# Patient Record
Sex: Female | Born: 1970 | Race: Black or African American | Hispanic: No | Marital: Married | Smoking: Former smoker
Health system: Southern US, Community
[De-identification: ages and names within clinical notes are randomized; demographics above are authoritative.]

## PROBLEM LIST (undated history)

## (undated) DIAGNOSIS — D252 Subserosal leiomyoma of uterus: Secondary | ICD-10-CM

## (undated) DIAGNOSIS — F419 Anxiety disorder, unspecified: Secondary | ICD-10-CM

## (undated) DIAGNOSIS — N760 Acute vaginitis: Secondary | ICD-10-CM

## (undated) DIAGNOSIS — N939 Abnormal uterine and vaginal bleeding, unspecified: Secondary | ICD-10-CM

## (undated) DIAGNOSIS — B373 Candidiasis of vulva and vagina: Secondary | ICD-10-CM

## (undated) DIAGNOSIS — I10 Essential (primary) hypertension: Secondary | ICD-10-CM

## (undated) DIAGNOSIS — B3731 Acute candidiasis of vulva and vagina: Secondary | ICD-10-CM

## (undated) HISTORY — PX: DILATION AND CURETTAGE OF UTERUS: SHX78

## (undated) HISTORY — DX: Acute vaginitis: N76.0

## (undated) HISTORY — DX: Abnormal uterine and vaginal bleeding, unspecified: N93.9

## (undated) HISTORY — DX: Subserosal leiomyoma of uterus: D25.2

## (undated) HISTORY — DX: Acute candidiasis of vulva and vagina: B37.31

## (undated) HISTORY — DX: Essential (primary) hypertension: I10

## (undated) HISTORY — DX: Candidiasis of vulva and vagina: B37.3

## (undated) HISTORY — DX: Anxiety disorder, unspecified: F41.9

---

## 2004-07-06 ENCOUNTER — Emergency Department: Payer: Self-pay | Admitting: Emergency Medicine

## 2005-03-29 ENCOUNTER — Emergency Department: Payer: Self-pay | Admitting: Emergency Medicine

## 2005-12-05 ENCOUNTER — Emergency Department: Payer: Self-pay | Admitting: Emergency Medicine

## 2007-06-15 ENCOUNTER — Ambulatory Visit: Payer: Self-pay | Admitting: Internal Medicine

## 2010-08-08 ENCOUNTER — Ambulatory Visit: Payer: Self-pay | Admitting: Family Medicine

## 2011-04-18 ENCOUNTER — Ambulatory Visit: Payer: Self-pay | Admitting: Internal Medicine

## 2011-04-19 ENCOUNTER — Ambulatory Visit: Payer: Self-pay | Admitting: Internal Medicine

## 2011-04-19 LAB — CBC WITH DIFFERENTIAL/PLATELET
Basophil #: 0 10*3/uL (ref 0.0–0.1)
Basophil %: 0.4 %
Eosinophil %: 3.2 %
HCT: 43.5 % (ref 35.0–47.0)
Lymphocyte #: 1.5 10*3/uL (ref 1.0–3.6)
MCH: 29.8 pg (ref 26.0–34.0)
MCV: 92 fL (ref 80–100)
Monocyte #: 0.4 10*3/uL (ref 0.0–0.7)
Monocyte %: 7.7 %
Neutrophil #: 2.6 10*3/uL (ref 1.4–6.5)
RDW: 12.7 % (ref 11.5–14.5)
WBC: 4.7 10*3/uL (ref 3.6–11.0)

## 2012-10-29 LAB — HM PAP SMEAR: HM PAP: NEGATIVE

## 2012-11-17 LAB — HM MAMMOGRAPHY

## 2014-10-23 ENCOUNTER — Telehealth: Payer: Self-pay | Admitting: *Deleted

## 2014-10-23 NOTE — Telephone Encounter (Addendum)
Pt states returned from vacation and left her night splint in the hotel, would like to pick one up in the morning, and what cost.  I informed pt the night splint would be $225.00. Pt states she will call to see if the hotel would mail to her.

## 2015-03-28 ENCOUNTER — Other Ambulatory Visit: Payer: Self-pay | Admitting: Obstetrics and Gynecology

## 2015-03-28 DIAGNOSIS — R920 Mammographic microcalcification found on diagnostic imaging of breast: Secondary | ICD-10-CM

## 2015-04-05 ENCOUNTER — Ambulatory Visit (INDEPENDENT_AMBULATORY_CARE_PROVIDER_SITE_OTHER): Payer: Commercial Managed Care - PPO | Admitting: Family Medicine

## 2015-04-05 ENCOUNTER — Encounter: Payer: Self-pay | Admitting: Family Medicine

## 2015-04-05 VITALS — BP 170/95 | HR 94 | Resp 16 | Ht 67.0 in | Wt 172.0 lb

## 2015-04-05 DIAGNOSIS — IMO0001 Reserved for inherently not codable concepts without codable children: Secondary | ICD-10-CM

## 2015-04-05 DIAGNOSIS — R030 Elevated blood-pressure reading, without diagnosis of hypertension: Secondary | ICD-10-CM | POA: Diagnosis not present

## 2015-04-05 DIAGNOSIS — Z3041 Encounter for surveillance of contraceptive pills: Secondary | ICD-10-CM

## 2015-04-05 DIAGNOSIS — S8391XA Sprain of unspecified site of right knee, initial encounter: Secondary | ICD-10-CM

## 2015-04-05 MED ORDER — NAPROXEN 500 MG PO TABS: 500.0000 mg | ORAL_TABLET | Freq: Two times a day (BID) | ORAL | Status: DC

## 2015-04-05 NOTE — Progress Notes (Signed)
Date:  04/05/2015   Name:  Katherine Fuentes   DOB:  [birth year]   MRN:  [id number]  PCP:  No primary care provider on file.    Chief Complaint: Establish Care and Knee Pain   History of Present Illness:  This is a 45 y.o. female who slipped on the ice one week ago, c/o R knee swelling and pain with flexion since then, taking ibuprofen 400 mg tid with some relief, able to bear weight and walk well. BP has been elevated since starting OCP, followed by Dr. Glennon Mac who plans to stop next month if BP not improved, just had blood work done with him last week.  Review of Systems:  Review of Systems  Constitutional: Negative for fever.  Respiratory: Negative for shortness of breath.   Cardiovascular: Negative for leg swelling.    There are no active problems to display for this patient.   Prior to Admission medications   Medication Sig Start Date End Date Taking? Authorizing Provider  norgestimate-ethinyl estradiol (ORTHO-CYCLEN,SPRINTEC,PREVIFEM) 0.25-35 MG-MCG tablet Take 1 tablet by mouth daily.   Yes Historical Provider, MD  naproxen (NAPROSYN) 500 MG tablet Take 1 tablet (500 mg total) by mouth 2 (two) times daily with a meal. 04/05/15   Adline Potter, MD    No Known Allergies  History reviewed. No pertinent past surgical history.  Social History  Substance Use Topics  . Smoking status: Former Smoker    Quit date: 04/04/2004  . Smokeless tobacco: Never Used  . Alcohol Use: No    Family History  Problem Relation Age of Onset  . Family history unknown: Yes    Medication list has been reviewed and updated.  Physical Examination: BP 170/95 mmHg  Pulse 94  Resp 16  Ht 5\' 7"  (1.702 m)  Wt 172 lb (78.019 kg)  BMI 26.93 kg/m2  LMP 02/11/2015 (Exact Date)  Physical Exam  Constitutional: She appears well-developed and well-nourished.  Musculoskeletal:  R knee stable with mod edema but no erythema or warmth, no sign distal edema  Neurological: She is alert.  Skin: Skin  is warm and dry.  Psychiatric: She has a normal mood and affect. Her behavior is normal.  Nursing note and vitals reviewed.   Assessment and Plan:  1. Right knee sprain, initial encounter Naprosyn x 2 wks, call if no better in 1 week, refer to PT then  2. Elevated BP Likely due to OCP and ibuprofen, GYN following  3. Oral contraceptive use Per GYN, may need alternative contraception  No Follow-up on file.  [name]. [facility]  04/05/2015

## 2015-04-10 ENCOUNTER — Ambulatory Visit: Payer: Self-pay

## 2015-04-10 ENCOUNTER — Other Ambulatory Visit: Payer: Self-pay

## 2015-04-12 ENCOUNTER — Ambulatory Visit (INDEPENDENT_AMBULATORY_CARE_PROVIDER_SITE_OTHER): Payer: Commercial Managed Care - PPO | Admitting: Family Medicine

## 2015-04-12 ENCOUNTER — Encounter: Payer: Self-pay | Admitting: Family Medicine

## 2015-04-12 ENCOUNTER — Other Ambulatory Visit: Payer: Self-pay | Admitting: Family Medicine

## 2015-04-12 ENCOUNTER — Ambulatory Visit
Admission: RE | Admit: 2015-04-12 | Discharge: 2015-04-12 | Disposition: A | Discharge status: Home / Self Care | Payer: Commercial Managed Care - PPO | Source: Ambulatory Visit | Attending: Family Medicine | Admitting: Family Medicine

## 2015-04-12 VITALS — BP 122/80

## 2015-04-12 DIAGNOSIS — M25561 Pain in right knee: Secondary | ICD-10-CM

## 2015-04-12 DIAGNOSIS — Z3041 Encounter for surveillance of contraceptive pills: Secondary | ICD-10-CM | POA: Diagnosis not present

## 2015-04-12 MED ORDER — TRAMADOL HCL 50 MG PO TABS: 50.0000 mg | ORAL_TABLET | Freq: Three times a day (TID) | ORAL | Status: DC | PRN

## 2015-04-12 NOTE — Progress Notes (Signed)
Date:  04/12/2015   Name:  Katherine Fuentes   DOB:  1970-05-27   MRN:  [id number]  PCP:  Adline Potter, MD    Chief Complaint: Knee Pain   History of Present Illness:  This is a 45 y.o. female seen last week for R knee sprain when slipped on ice, placed on Naprosyn, pain unimproved and knee now swollen.  Review of Systems:  Review of Systems  Neurological:       No distal numbness/tingling/weakness    Patient Active Problem List   Diagnosis Date Noted  . Oral contraceptive use 04/12/2015    Prior to Admission medications   Medication Sig Start Date End Date Taking? Authorizing Provider  naproxen (NAPROSYN) 500 MG tablet Take 1 tablet (500 mg total) by mouth 2 (two) times daily with a meal. 04/05/15  Yes Adline Potter, MD  norgestimate-ethinyl estradiol (ORTHO-CYCLEN,SPRINTEC,PREVIFEM) 0.25-35 MG-MCG tablet Take 1 tablet by mouth daily.   Yes Historical Provider, MD  traMADol (ULTRAM) 50 MG tablet Take 1 tablet (50 mg total) by mouth every 8 (eight) hours as needed. 04/12/15   Adline Potter, MD    No Known Allergies  No past surgical history on file.  Social History  Substance Use Topics  . Smoking status: Former Smoker    Quit date: 04/04/2004  . Smokeless tobacco: Never Used  . Alcohol Use: No    Family History  Problem Relation Age of Onset  . Family history unknown: Yes    Medication list has been reviewed and updated.  Physical Examination: BP 122/80 mmHg  LMP 04/10/2015  Physical Exam  Constitutional: She appears well-developed and well-nourished.  Musculoskeletal:  Knee stable all planes, no pain with patellar movement, mild edema, no erythema or warmth  Nursing note and vitals reviewed.   Assessment and Plan:  1. Right knee pain Persistent x 2 weeks, check XR, tramadol prn pain, consider PT or ortho referral - DG Knee Complete 4 Views Right; Future  2. Oral contraceptive use  Return if symptoms worsen or fail to improve.  [name]. [facility]  04/12/2015

## 2015-04-18 ENCOUNTER — Ambulatory Visit: Payer: Commercial Managed Care - PPO | Attending: Family Medicine | Admitting: Physical Therapy

## 2015-04-18 ENCOUNTER — Encounter: Payer: Self-pay | Admitting: Physical Therapy

## 2015-04-18 DIAGNOSIS — M25661 Stiffness of right knee, not elsewhere classified: Secondary | ICD-10-CM | POA: Diagnosis present

## 2015-04-18 DIAGNOSIS — R262 Difficulty in walking, not elsewhere classified: Secondary | ICD-10-CM | POA: Diagnosis present

## 2015-04-18 DIAGNOSIS — M25561 Pain in right knee: Secondary | ICD-10-CM | POA: Insufficient documentation

## 2015-04-18 NOTE — Therapy (Signed)
[facility] [address] [place], [place], [address] Phone: [phone]   Fax:  [phone]  Physical Therapy Evaluation  Patient Details  Name: Katherine Fuentes MRN: [id number] Date of Birth: 1970-07-05 Referring Provider: Adline Potter  Encounter Date: 04/18/2015      PT End of Session - 04/18/15 1804    Visit Number 1   Number of Visits 8   PT Start Time [id number]   PT Stop Time [id number]   PT Time Calculation (min) 50 min   Activity Tolerance Patient tolerated treatment well;Patient limited by pain   Behavior During Therapy [facility] for tasks assessed/performed      History reviewed. No pertinent past medical history.  History reviewed. No pertinent past surgical history.  There were no vitals filed for this visit.  Visit Diagnosis:  Right knee pain  Knee stiffness, right  Difficulty walking down stairs      Subjective Assessment - 04/18/15 1800    Subjective Pt presents to PT with R knee pain after almost sliipping on ice. Pt reports 0/10 pain at rest but increase pain with certain activities and motions.    Limitations Standing;House hold activities   How long can you stand comfortably? >25 mins   Patient Stated Goals Stand for long periods of time with no pain/ Ascend/descend stairs without feeling of giving out   Currently in Pain? No/denies     Objective: Knee AROM L flex: 123 deg. R flex: 115 deg with pain.  R/L knee ext: WNL.  MMT L hip flex: 5/5. R hip flex: 4+/5.  L knee flex: 5/5. R knee flex: 5/5.  L knee ext: 5/5. R knee ext: 4+/5.  Circumferential Measurements L knee- joint line: 38 cm. mid calf: 35 cm. distal quad: 43 cm.  R knee- joint line: 40 cm. mid calf: 35cm. distal quad: 43 cm.   Ice: 10 mins         PT Education - 04/18/15 1803    Education provided Yes   Education Details see above handout. Pt was also instructed to use ice 2-3x/ day or biofreeze to reduce swelling.   Person(s) Educated  Patient   Methods Explanation;Demonstration;Handout   Comprehension Verbalized understanding;Returned demonstration            Plan - 04/18/15 1805    Clinical Impression Statement Pt is a 45 y/o female who presents to PT with R knee pain after almost slipping on ice. Pt states she was walking and almost slipped but was able to catch herself, does not recall hitting her knee on ground. Pt states R knee feels stiff in the morning but feels better after walking. 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 Pt reports 0/10 pain at rest but the pain increases to 3/10 with extension and 6/10 with flexion with pain occuring at superior to patella. That pain immediately subsides when out of painful position. Pt states she experiences giving way feeling when descending steps at work with an increase in pain to 3/10 easing out of position. Thessaly was negative. Pt is tender to palpation at superior and superiomedial to patella with visible swelling around joint line. Tight B hamstrings noted. B. Knee AROM: L flexion: 123 deg. R flex: 115 deg with pain. R/L knee ext: WNL. MMT: L hip flex: 5/5. R hip flex: 4+/5. L knee flex: 5/5. R knee flex: 5/5. L knee ext: 5/5. R  knee ext: 4+/5. Circumferential Measurements: L knee- joint line: 38 cm. mid calf: 35 cm. distal quad: 43 cm. R knee- joint line: 40 cm. mid calf: 35cm. distal quad: 43 cm. Patellar motion is normal bilaterally. Pt will benefit from skilled PT to improve flexibility and increase AROM in order to improve pain-free mobility.    Pt will benefit from skilled therapeutic intervention in order to improve on the following deficits Abnormal gait;Decreased balance;Decreased mobility;Decreased strength;Impaired flexibility;Pain;Difficulty walking;Decreased range of motion;Decreased endurance;Decreased activity tolerance   Rehab Potential Good   PT Frequency 2x / week   PT Duration 4  weeks   PT Treatment/Interventions ADLs/Self Care Home Management;Cryotherapy;Electrical Stimulation;Moist Heat;Balance training;Therapeutic exercise;Therapeutic activities;Functional mobility training;Stair training;Gait training;Patient/family education;Manual techniques;Passive range of motion   PT Next Visit Plan eccentric quad control/flexibility/LEFS   PT Home Exercise Plan see above handout   Consulted and Agree with Plan of Care Patient         Problem List Patient Active Problem List   Diagnosis Date Noted  . Oral contraceptive use 04/12/2015   Pura Spice, PT, DPT # [id number]   04/19/2015, 8:56 AM  [facility] [address] [place], [place], [address] Phone: [phone]   Fax:  [phone]  Name: Katherine Fuentes MRN: [id number] Date of Birth: Apr 11, 1970

## 2015-04-24 ENCOUNTER — Encounter: Payer: Commercial Managed Care - PPO | Admitting: Physical Therapy

## 2015-04-26 ENCOUNTER — Encounter: Payer: Self-pay | Admitting: Physical Therapy

## 2015-04-26 ENCOUNTER — Ambulatory Visit: Payer: Commercial Managed Care - PPO | Admitting: Physical Therapy

## 2015-04-26 DIAGNOSIS — R262 Difficulty in walking, not elsewhere classified: Secondary | ICD-10-CM

## 2015-04-26 DIAGNOSIS — M25661 Stiffness of right knee, not elsewhere classified: Secondary | ICD-10-CM

## 2015-04-26 DIAGNOSIS — M25561 Pain in right knee: Secondary | ICD-10-CM | POA: Diagnosis not present

## 2015-04-26 NOTE — Therapy (Signed)
[facility] [address] [place], [place], [address] Phone: [phone]   Fax:  [phone]  Physical Therapy Treatment  Patient Details  Name: Katherine Fuentes MRN: [id number] Date of Birth: June 07, 1970 Referring Provider: Adline Potter  Encounter Date: 04/26/2015      PT End of Session - 04/26/15 1534    Visit Number 2   Number of Visits 8   Authorization - Visit Number 2   PT Start Time [id number]   PT Stop Time [id number]   PT Time Calculation (min) 59 min   Activity Tolerance Patient tolerated treatment well;Patient limited by pain   Behavior During Therapy [facility] for tasks assessed/performed      History reviewed. No pertinent past medical history.  History reviewed. No pertinent past surgical history.  There were no vitals filed for this visit.  Visit Diagnosis:  Right knee pain  Difficulty walking down stairs  Knee stiffness, right      Subjective Assessment - 04/26/15 1531    Subjective Pt presents to therapy today reporting 0/10 pain. Pt reports pain is worst when she is navigating stairs and is performing her work duties. Pt reports her work got her a mat to work on, which is helping to decrease her knee pain. Pt reports she is using the Biofreeze and wearing ice packs to help alleviate pain.   How long can you stand comfortably? >25 mins   Patient Stated Goals Stand for long periods of time with no pain/ Ascend/descend stairs without feeling of giving out        Objective: Manual: superior/inferior patellar mobilizations x 4 for 30 seconds. Proximal hamstring stretch x 2 for 30 seconds (pt had a hard time relaxing). STM to distal quad/hamstring. Ther ex : Step ups on 6" step in //-bars 3 x 10. Forwards/lateral  step ups on BOSU ball in //-bars 30 with verbal postural cueing. Eccentric heel taps off 3" step in //-bars 2 x 10. Hamstring stretch off stairs x 3 for 20 seconds.  Pt response to treatment for medical necessity:  Pt will benefit from skilled PT to increase quad strengthening in order to safely ascend/descend stairs. Pt will benefit from manual techniques in order to improve ROM and decrease pain.          PT Long Term Goals - 04/18/15 1828    PT LONG TERM GOAL #1   Title Pt will increase R knee AROM to >120 deg. in order to complete ADLs with ease.   Baseline 115 deg on 2/1   Time 4   Period Weeks   Status New   PT LONG TERM GOAL #2   Title Pt will be able to stand for >20 mins without pain in order to complete work duties.   Baseline <20 mins 2/1   Time 4   Period Weeks   Status New   PT LONG TERM GOAL #3   Title Pt will increase R knee ext MMT to 5/5 in order to improve functional mobility.   Baseline 4+/5 on 2/1   Time 4   Period Weeks   Status New   PT LONG TERM GOAL #4   Title Pt will be able to descend >5 steps pain free in order to safely navigate steps at home/work.   Time 4   Period Weeks   Status New           Plan - 04/26/15 1615    Clinical Impression Statement Pt demonstrates  poor eccentric control with step downs on 6" step and requires verbal cueing to step down with a slower pace. Pt also required postural cueing throughout session with ther ex. Pt demonstrated slight hypomobility with inferior patellar mobilizations. Tightness noted at distal quad and distal hamstring, with pt describing hamstring as a burning pain. Pt demonstrated decreased medial arch and was instructed on the importance of proper footwear, especially since her job entails being on her feet all day.    Pt will benefit from skilled therapeutic intervention in order to improve on the following deficits Abnormal gait;Decreased balance;Decreased mobility;Decreased strength;Impaired flexibility;Pain;Difficulty walking;Decreased range of motion;Decreased endurance;Decreased activity tolerance   Rehab Potential Good   PT Frequency 2x / week   PT Duration 4 weeks   PT Treatment/Interventions ADLs/Self  Care Home Management;Cryotherapy;Electrical Stimulation;Moist Heat;Balance training;Therapeutic exercise;Therapeutic activities;Functional mobility training;Stair training;Gait training;Patient/family education;Manual techniques;Passive range of motion   PT Next Visit Plan eccentric quad control/flexibility/LEFS   PT Home Exercise Plan see above handout   Consulted and Agree with Plan of Care Patient        Problem List Patient Active Problem List   Diagnosis Date Noted  . Oral contraceptive use 04/12/2015    Georgina Pillion, SPT and Dorice Lamas, SPT 04/26/2015, 4:22 PM  [facility] [address] [place], [place], [address] Phone: [phone]   Fax:  [phone]  Name: Katherine Fuentes MRN: [id number] Date of Birth: 1970-07-22

## 2015-05-01 ENCOUNTER — Encounter: Payer: Commercial Managed Care - PPO | Admitting: Physical Therapy

## 2015-05-03 ENCOUNTER — Other Ambulatory Visit: Payer: Self-pay

## 2015-05-03 ENCOUNTER — Ambulatory Visit: Admission: RE | Admit: 2015-05-03 | Payer: Self-pay | Source: Ambulatory Visit

## 2015-05-03 ENCOUNTER — Encounter: Payer: Commercial Managed Care - PPO | Admitting: Physical Therapy

## 2015-05-08 ENCOUNTER — Encounter: Payer: Commercial Managed Care - PPO | Admitting: Physical Therapy

## 2015-05-10 ENCOUNTER — Encounter: Payer: Commercial Managed Care - PPO | Admitting: Physical Therapy

## 2015-05-15 ENCOUNTER — Encounter: Payer: Commercial Managed Care - PPO | Admitting: Physical Therapy

## 2015-05-17 ENCOUNTER — Encounter: Payer: Commercial Managed Care - PPO | Admitting: Physical Therapy

## 2016-05-13 IMAGING — CR DG KNEE COMPLETE 4+V*R*
4 series · 4 of 4 positions shown · non-contrast
Comparison: No recent.

CLINICAL DATA: Fall.

EXAM:
RIGHT KNEE - COMPLETE 4+ VIEW

[knee ap]
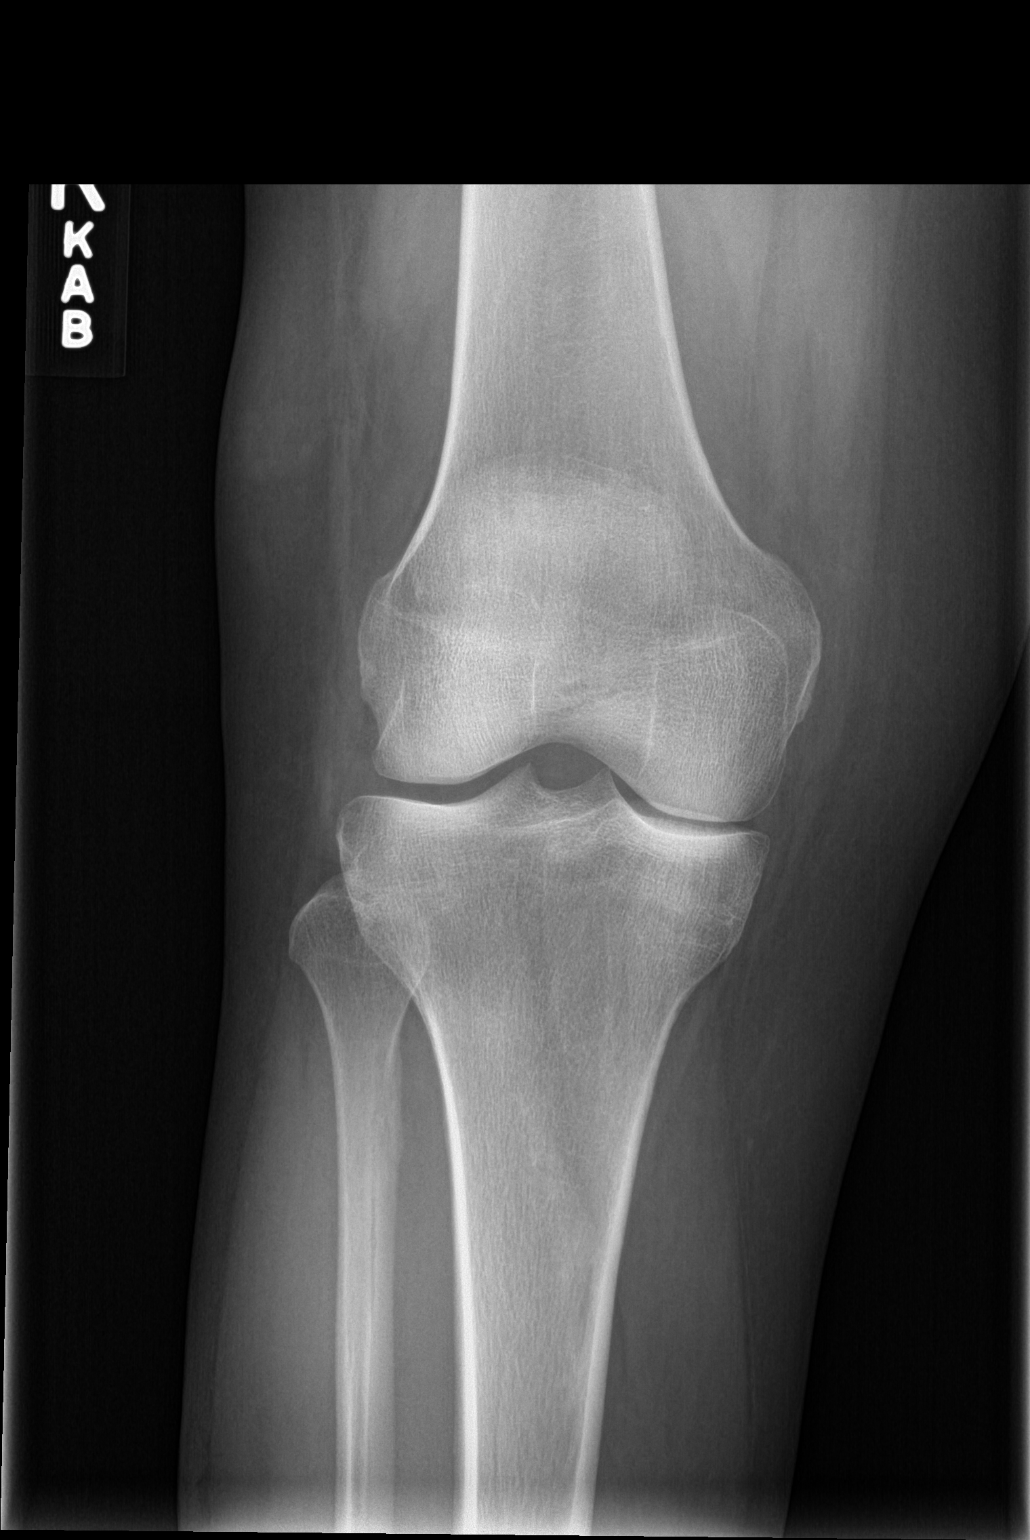

[tunnel]
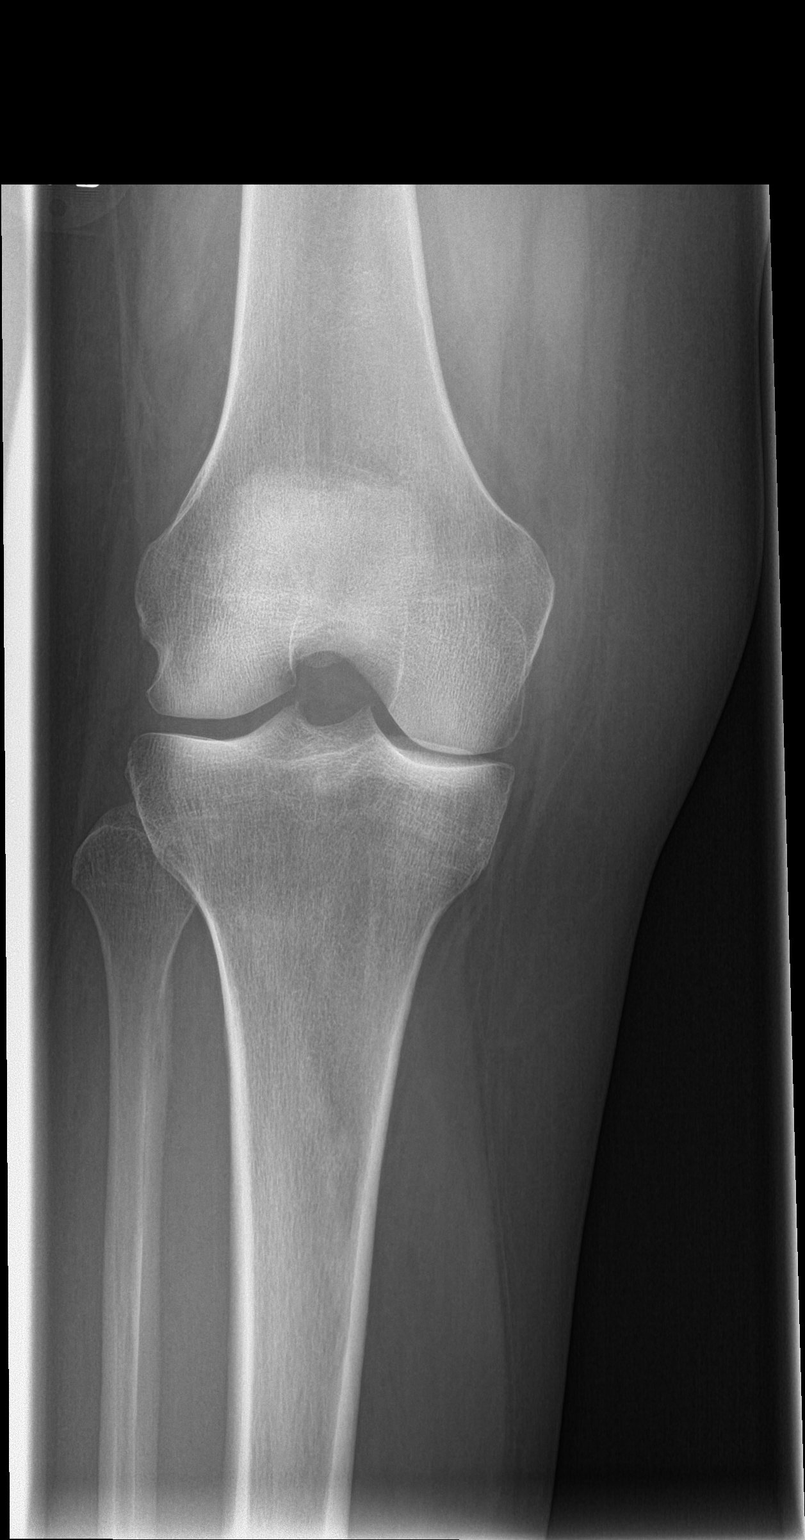

[knee lat]
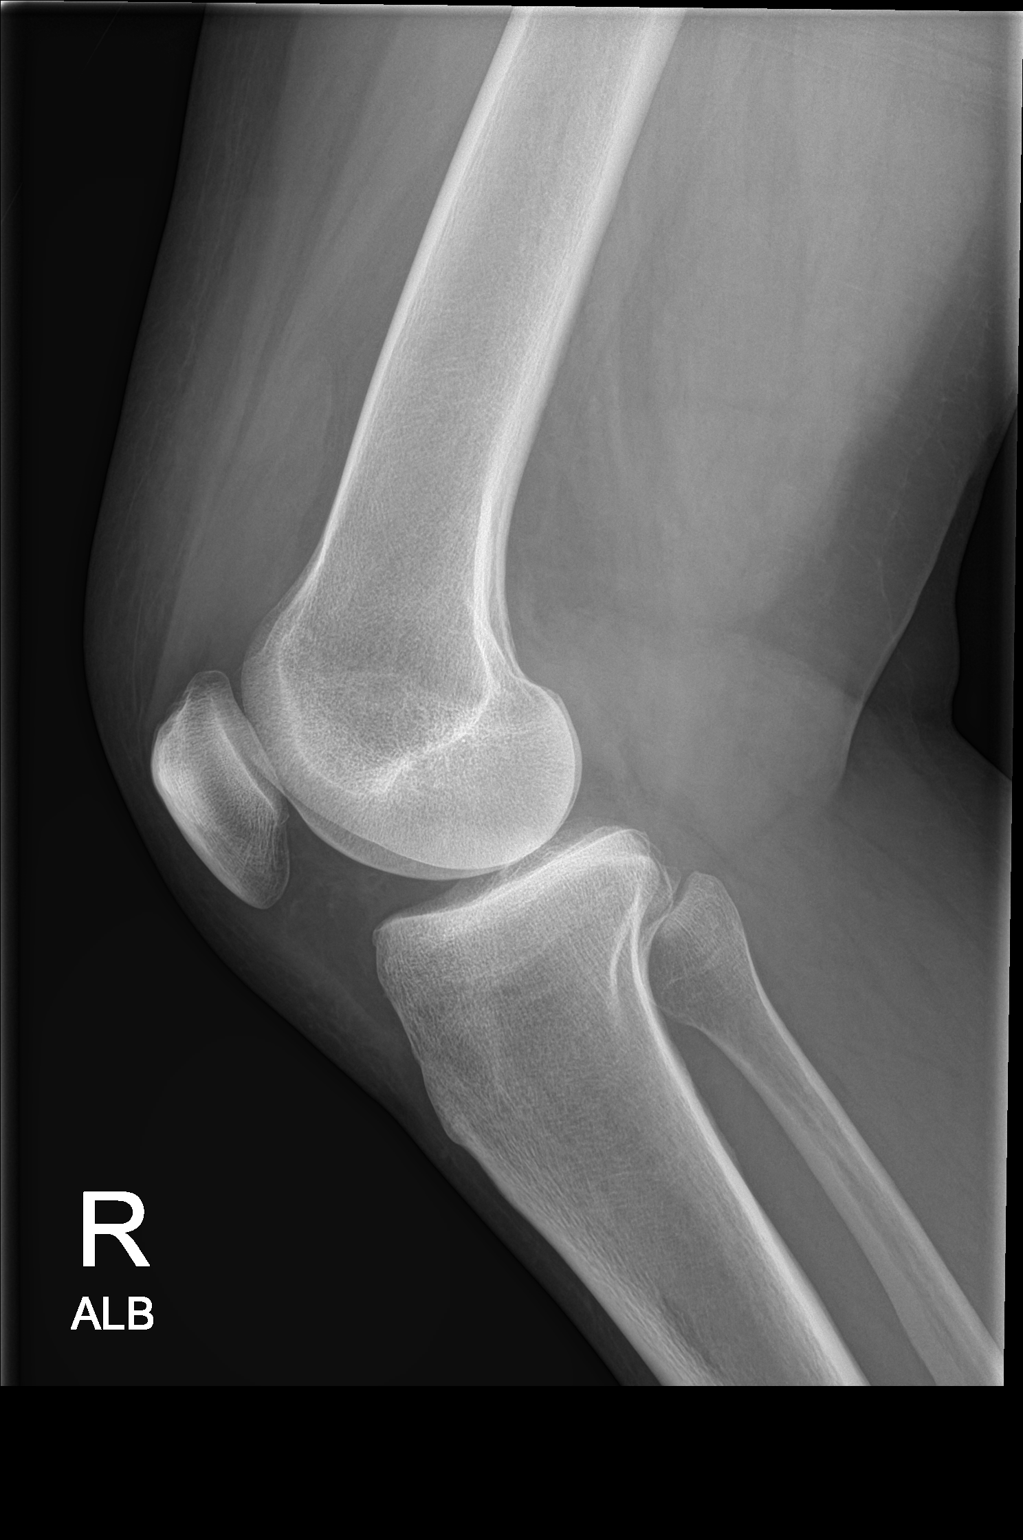

[patella skyline]
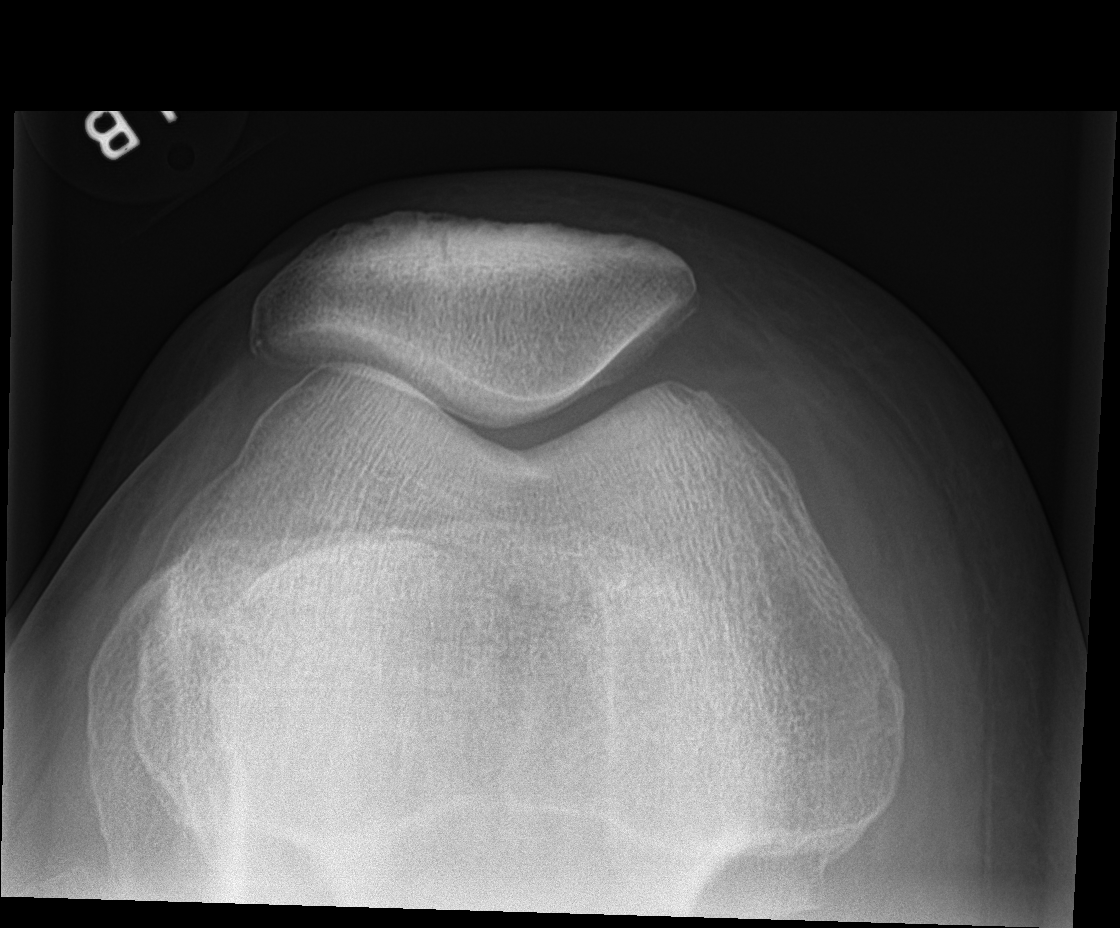

[4 of 4 positions shown; findings below may reference images not displayed]

FINDINGS: No acute bony abnormality identified. Mild degenerative changes
noted about the medial compartment small knee joint effusion noted.
IMPRESSION: Negative.

## 2016-05-14 ENCOUNTER — Telehealth: Payer: Self-pay | Admitting: Obstetrics and Gynecology

## 2016-05-14 MED ORDER — NORGESTIMATE-ETH ESTRADIOL 0.25-35 MG-MCG PO TABS: 1.0000 | ORAL_TABLET | Freq: Every day | ORAL | 0 refills | Status: DC

## 2016-05-14 NOTE — Telephone Encounter (Signed)
Patient was called to r/s annual that was scheduled for 3/1 due to Epic overbook and will need her bp med and bc refilled one time before her appt on 03/23. She has enough of bp meds for today, but needs by tomorrow.  Patient Pharm.  [facility] [place], [place] Please advise.

## 2016-05-14 NOTE — Telephone Encounter (Signed)
1 refill sent to pharm to last pt to annual exam

## 2016-05-15 ENCOUNTER — Ambulatory Visit: Payer: Self-pay | Admitting: Obstetrics and Gynecology

## 2016-05-19 ENCOUNTER — Telehealth: Payer: Self-pay

## 2016-05-19 DIAGNOSIS — I10 Essential (primary) hypertension: Secondary | ICD-10-CM

## 2016-05-19 MED ORDER — HYDROCHLOROTHIAZIDE 25 MG PO TABS: 25.0000 mg | ORAL_TABLET | Freq: Every day | ORAL | 3 refills | Status: DC

## 2016-05-19 NOTE — Telephone Encounter (Signed)
Pt aware RX is ready and will be here for f/u appt

## 2016-05-19 NOTE — Telephone Encounter (Signed)
Please let pt know I called her in addition refills. Thank you.

## 2016-05-19 NOTE — Telephone Encounter (Signed)
Dr Glennon Mac, according to Kerby Nora looks like the medication is hydrochlorothiazide. Can you RX this medication for pt please?

## 2016-05-19 NOTE — Telephone Encounter (Signed)
Pt called states pharm doesn't have rx refill for blood pressure med.  Pt is out.  Her appt was rescheduled to 3/23rd.

## 2016-06-06 ENCOUNTER — Ambulatory Visit: Payer: Self-pay | Admitting: Obstetrics and Gynecology

## 2016-06-30 ENCOUNTER — Telehealth: Payer: Self-pay

## 2016-06-30 NOTE — Telephone Encounter (Signed)
Pt needs appt scheduled before refills will be sent in. Please schedule for pt

## 2016-06-30 NOTE — Telephone Encounter (Signed)
Pt calling for refill on xanax.  Pharmacy send request.  She is out of med. [phone]

## 2016-07-01 NOTE — Telephone Encounter (Signed)
Pt is schedule for 07/04/16 with Dr. Glennon Mac

## 2016-07-04 ENCOUNTER — Ambulatory Visit (INDEPENDENT_AMBULATORY_CARE_PROVIDER_SITE_OTHER): Payer: Commercial Managed Care - PPO | Admitting: Obstetrics and Gynecology

## 2016-07-04 ENCOUNTER — Inpatient Hospital Stay
Admission: RE | Admit: 2016-07-04 | Discharge: 2016-07-04 | Disposition: A | Discharge status: Home / Self Care | Payer: Self-pay | Source: Ambulatory Visit | Attending: *Deleted | Admitting: *Deleted

## 2016-07-04 ENCOUNTER — Encounter: Payer: Self-pay | Admitting: Obstetrics and Gynecology

## 2016-07-04 ENCOUNTER — Other Ambulatory Visit: Payer: Self-pay | Admitting: *Deleted

## 2016-07-04 VITALS — BP 116/74 | Ht 67.0 in | Wt 156.0 lb

## 2016-07-04 DIAGNOSIS — F419 Anxiety disorder, unspecified: Secondary | ICD-10-CM

## 2016-07-04 DIAGNOSIS — Z1231 Encounter for screening mammogram for malignant neoplasm of breast: Secondary | ICD-10-CM | POA: Diagnosis not present

## 2016-07-04 DIAGNOSIS — Z9289 Personal history of other medical treatment: Secondary | ICD-10-CM

## 2016-07-04 DIAGNOSIS — Z0000 Encounter for general adult medical examination without abnormal findings: Secondary | ICD-10-CM

## 2016-07-04 DIAGNOSIS — Z1389 Encounter for screening for other disorder: Secondary | ICD-10-CM | POA: Diagnosis not present

## 2016-07-04 DIAGNOSIS — Z113 Encounter for screening for infections with a predominantly sexual mode of transmission: Secondary | ICD-10-CM

## 2016-07-04 DIAGNOSIS — Z13220 Encounter for screening for lipoid disorders: Secondary | ICD-10-CM | POA: Diagnosis not present

## 2016-07-04 DIAGNOSIS — Z1329 Encounter for screening for other suspected endocrine disorder: Secondary | ICD-10-CM | POA: Diagnosis not present

## 2016-07-04 DIAGNOSIS — Z3041 Encounter for surveillance of contraceptive pills: Secondary | ICD-10-CM | POA: Diagnosis not present

## 2016-07-04 DIAGNOSIS — N6322 Unspecified lump in the left breast, upper inner quadrant: Secondary | ICD-10-CM

## 2016-07-04 DIAGNOSIS — Z124 Encounter for screening for malignant neoplasm of cervix: Secondary | ICD-10-CM

## 2016-07-04 DIAGNOSIS — Z01419 Encounter for gynecological examination (general) (routine) without abnormal findings: Secondary | ICD-10-CM | POA: Diagnosis not present

## 2016-07-04 DIAGNOSIS — I10 Essential (primary) hypertension: Secondary | ICD-10-CM

## 2016-07-04 DIAGNOSIS — Z131 Encounter for screening for diabetes mellitus: Secondary | ICD-10-CM | POA: Diagnosis not present

## 2016-07-04 DIAGNOSIS — Z1339 Encounter for screening examination for other mental health and behavioral disorders: Secondary | ICD-10-CM

## 2016-07-04 DIAGNOSIS — Z1331 Encounter for screening for depression: Secondary | ICD-10-CM

## 2016-07-04 DIAGNOSIS — Z1239 Encounter for other screening for malignant neoplasm of breast: Secondary | ICD-10-CM

## 2016-07-04 MED ORDER — HYDROCHLOROTHIAZIDE 25 MG PO TABS: 25.0000 mg | ORAL_TABLET | Freq: Every day | ORAL | 3 refills | Status: DC

## 2016-07-04 MED ORDER — ALPRAZOLAM 0.25 MG PO TABS: 0.2500 mg | ORAL_TABLET | Freq: Three times a day (TID) | ORAL | 0 refills | Status: DC | PRN

## 2016-07-04 NOTE — Progress Notes (Signed)
PCP: Katherine Potter, MD  Chief Complaint  Patient presents with  . Annual Exam    History of Present Illness:  [name] is a 46 y.o. G1P0010 who LMP was Patient's last menstrual period was 05/06/2016., presents today for her annual examination.  Her menses are regular every 28-30 days, lasting 6 day(s).  Dysmenorrhea none. She does not have intermenstrual bleeding.  She does not have vasomotor sx.   She is sexually issues. No pain or other issues with intercourse. She does not have vaginal dryness.  Last Pap: October 28, 2012  Results were: no abnormalities /neg HPV DNA.  Hx of STDs: none  Last mammogram: 2014, BiRads 0. HAS NOT HAD FOLLOW UP. No breast complaints today. There is no FH of breast cancer. There is no FH of ovarian cancer. The patient does not do self-breast exams.  Colonoscopy: never DEXA: has not been screened for osteoporosis  Tobacco use: The patient quit smoking at age 65. Alcohol use: moderate to heavy Exercise: not active  The patient wears seatbelts: yes.     Review of Systems  Constitutional: Negative.   HENT: Negative.   Eyes: Negative.   Respiratory: Negative.   Cardiovascular: Negative.   Gastrointestinal: Negative.   Genitourinary: Negative.   Musculoskeletal: Negative.   Skin: Negative.   Neurological: Negative.   Psychiatric/Behavioral: Negative.     Past Medical History:  Diagnosis Date  . Abnormal uterine bleeding   . Acute vaginitis   . Anxiety   . Hypertension   . Monilial vulvovaginitis   . Subserous leiomyoma of uterus     Past Surgical History: No surgeries  Medications:   Medication Sig Start Date End Date Taking? Authorizing Provider  ALPRAZolam Duanne Moron) 0.25 MG tablet Take 0.25 mg by mouth at bedtime as needed for anxiety.   Yes Historical Provider, MD  hydrochlorothiazide (HYDRODIURIL) 25 MG tablet Take 1 tablet (25 mg total) by mouth daily. 05/19/16  Yes Will Bonnet, MD  norgestimate-ethinyl  estradiol (ORTHO-CYCLEN,SPRINTEC,PREVIFEM) 0.25-35 MG-MCG tablet Take 1 tablet by mouth daily. 05/14/16  Yes Rod Can, CNM    Allergies: No Known Allergies  Gynecologic History:  Patient's last menstrual period was 05/06/2016. Contraception: OCP (estrogen/progesterone)  Obstetric History: G1P0010  Family History  Problem Relation Age of Onset  . Colon cancer Maternal Grandfather     Social History   Social History  . Marital status: Married    Spouse name: N/A  . Number of children: N/A  . Years of education: N/A   Occupational History  . Not on file.   Social History Main Topics  . Smoking status: Former Smoker    Quit date: 04/04/2004  . Smokeless tobacco: Never Used  . Alcohol use No  . Drug use: No  . Sexual activity: Yes    Birth control/ protection: Pill   Other Topics Concern  . Not on file   Social History Narrative  . No narrative on file    Physical Exam BP 116/74   Ht 5\' 7"  (1.702 m)   Wt 156 lb (70.8 kg)   LMP 05/06/2016   BMI 24.43 kg/m   Physical Exam  Constitutional: She is oriented to person, place, and time. She appears well-developed and well-nourished. No distress.  Genitourinary: Uterus normal. Pelvic exam was performed with patient supine. There is no rash, tenderness, lesion or injury on the right labia. There is no rash, tenderness, lesion or injury on the left labia. No erythema, tenderness or  bleeding in the vagina. No signs of injury around the vagina. No vaginal discharge found. Right adnexum does not display mass, does not display tenderness and does not display fullness. Left adnexum does not display mass, does not display tenderness and does not display fullness. Cervix does not exhibit motion tenderness or discharge.   Uterus is mobile and anteverted. Uterus is not enlarged, tender or exhibiting a mass.  HENT:  Head: Normocephalic and atraumatic.  Eyes: EOM are normal. No scleral icterus.  Neck: Normal range of motion. Neck  supple. No thyromegaly present.  Cardiovascular: Normal rate and regular rhythm.  Exam reveals no gallop and no friction rub.   No murmur heard. Pulmonary/Chest: Effort normal and breath sounds normal. No respiratory distress. She has no wheezes. She has no rales. Right breast exhibits no inverted nipple, no mass, no nipple discharge, no skin change and no tenderness. Left breast exhibits mass (See image). Left breast exhibits no inverted nipple, no nipple discharge, no skin change and no tenderness.    Abdominal: Soft. Bowel sounds are normal. She exhibits no distension and no mass. There is no tenderness. There is no rebound and no guarding.  Musculoskeletal: Normal range of motion. She exhibits no edema or tenderness.  Lymphadenopathy:    She has no cervical adenopathy.       Right: No inguinal adenopathy present.       Left: No inguinal adenopathy present.  Neurological: She is alert and oriented to person, place, and time. No cranial nerve deficit.  Skin: Skin is warm and dry. No rash noted. No erythema.  Psychiatric: She has a normal mood and affect. Her behavior is normal. Judgment normal.   Female chaperone present for pelvic and breast  portions of the physical exam  Results: AUDIT Questionnaire (screen for alcoholism): 11 PHQ-9: 4   Assessment: 46 y.o. G1P0010 here for routine annual gynecologic examination. Poor follow up on mammography with small finding today.    Plan: 1. Women's annual routine gynecological examination - Hgb A1c w/o eAG - Comprehensive metabolic panel - CBC - Lipid Panel With LDL/HDL Ratio - TSH - IGP,CtNg,AptimaHPV,rfx16/18,45 (pap smear)  2. Pap smear for cervical cancer screening - IGP,CtNg,AptimaHPV,rfx16/18,45 (pap smear)  3. Benign essential hypertension - Comprehensive metabolic panel - CBC - hydrochlorothiazide (HYDRODIURIL) 25 MG tablet; Take 1 tablet (25 mg total) by mouth daily.  Dispense: 90 tablet; Refill: 3  6. Screening for  alcohol problem AUDIT questionnaire with score of 11 today. Discussed not using to self-medicate. Discussed warning signs of too much drinking and long-term health effects.    7. Screening for depression No evidence of depression today  8. Anxiety - continue to have significant anxiety. Has had a good response to infrequent xanax dosing in past.  Going through a divorce currently.  Follow-up prn.  - ALPRAZolam (XANAX) 0.25 MG tablet; Take 1 tablet (0.25 mg total) by mouth 3 (three) times daily as needed for anxiety.  Dispense: 30 tablet; Refill: 0  9. Screening cholesterol level - Lipid Panel With LDL/HDL Ratio  10. Screening for thyroid disorder - TSH  11. Screening for diabetes mellitus - Hgb A1c w/o eAG  12. Laboratory exam ordered as part of routine general medical examination - Comprehensive metabolic panel - CBC  13. Encounter for surveillance of contraceptive pills - continue current. Refills prn.  48. Screen for STD (sexually transmitted disease) - IGP,CtNg,AptimaHPV,rfx16/18,45 (pap smear with gonorrhea/chlamydia testing)  15. Benign hypertension - hydrochlorothiazide (HYDRODIURIL) 25 MG tablet; Take 1 tablet (25  mg total) by mouth daily.  Dispense: 90 tablet; Refill: 3  16. Breast lump on left side at 11 o'clock position - MM DIAG BREAST TOMO BILATERAL; Future - US BREAST LTD UNI RIGHT INC AXILLA; Future - US BREAST LTD UNI LEFT INC AXILLA; Future    Prentice Docker, MD 07/04/2016 8:20 AM

## 2016-07-08 ENCOUNTER — Encounter: Payer: Self-pay | Admitting: Obstetrics and Gynecology

## 2016-07-08 LAB — COMPREHENSIVE METABOLIC PANEL
ALT: 12 IU/L (ref 0–32)
AST: 29 IU/L (ref 0–40)
Albumin/Globulin Ratio: 1.2 (ref 1.2–2.2)
Albumin: 4.1 g/dL (ref 3.5–5.5)
Alkaline Phosphatase: 67 IU/L (ref 39–117)
BILIRUBIN TOTAL: 0.4 mg/dL (ref 0.0–1.2)
BUN/Creatinine Ratio: 13 (ref 9–23)
BUN: 9 mg/dL (ref 6–24)
CHLORIDE: 100 mmol/L (ref 96–106)
CO2: 20 mmol/L (ref 18–29)
Calcium: 9.5 mg/dL (ref 8.7–10.2)
Creatinine, Ser: 0.71 mg/dL (ref 0.57–1.00)
GFR calc non Af Amer: 102 mL/min/{1.73_m2} (ref >59)
GFR, EST AFRICAN AMERICAN: 118 mL/min/{1.73_m2} (ref >59)
GLOBULIN, TOTAL: 3.3 g/dL (ref 1.5–4.5)
Glucose: 87 mg/dL (ref 65–99)
POTASSIUM: 4 mmol/L (ref 3.5–5.2)
SODIUM: 141 mmol/L (ref 134–144)
Total Protein: 7.4 g/dL (ref 6.0–8.5)

## 2016-07-08 LAB — LIPID PANEL WITH LDL/HDL RATIO
CHOLESTEROL TOTAL: 215 mg/dL — AB (ref 100–199)
HDL: 105 mg/dL (ref >39)
LDL CALC: 82 mg/dL (ref 0–99)
LDl/HDL Ratio: 0.8 ratio (ref 0.0–3.2)
TRIGLYCERIDES: 140 mg/dL (ref 0–149)
VLDL Cholesterol Cal: 28 mg/dL (ref 5–40)

## 2016-07-08 LAB — CBC
HEMOGLOBIN: 13.5 g/dL (ref 11.1–15.9)
Hematocrit: 40.1 % (ref 34.0–46.6)
MCH: 30.3 pg (ref 26.6–33.0)
MCHC: 33.7 g/dL (ref 31.5–35.7)
MCV: 90 fL (ref 79–97)
PLATELETS: 269 10*3/uL (ref 150–379)
RBC: 4.46 x10E6/uL (ref 3.77–5.28)
RDW: 13.9 % (ref 12.3–15.4)
WBC: 5.3 10*3/uL (ref 3.4–10.8)

## 2016-07-08 LAB — HGB A1C W/O EAG: HEMOGLOBIN A1C: 5 % (ref 4.8–5.6)

## 2016-07-08 LAB — TSH: TSH: 0.956 u[IU]/mL (ref 0.450–4.500)

## 2016-07-09 LAB — IGP,CTNG,APTIMAHPV,RFX16/18,45
CHLAMYDIA, NUC. ACID AMP: NEGATIVE
Gonococcus by Nucleic Acid Amp: NEGATIVE
HPV Aptima: NEGATIVE
PAP Smear Comment: 0

## 2016-07-10 ENCOUNTER — Other Ambulatory Visit: Payer: Self-pay

## 2016-07-10 ENCOUNTER — Telehealth: Payer: Self-pay | Admitting: Obstetrics and Gynecology

## 2016-07-10 MED ORDER — NORGESTIMATE-ETH ESTRADIOL 0.25-35 MG-MCG PO TABS: 1.0000 | ORAL_TABLET | Freq: Every day | ORAL | 11 refills | Status: DC

## 2016-07-10 NOTE — Telephone Encounter (Signed)
Resent OCP RX. Form was faxed, was put in scan pile so will have to wait until it is scanned in so I can reprint and fax again. Please let pt know if she calls back tomorrow or Monday as I will not be in the office.

## 2016-07-10 NOTE — Telephone Encounter (Signed)
Patient l/m on my v/m that she saw Dr Glennon Mac last Friday, that her employer faxed a form for her physical and has not received the completed form back. Also, her Sprintec prescription is usually written so that she can pick up 3 mos at a time, for one year. This time, she was only given 1 mo w/ no refills. Please contact patient at [phone].

## 2016-07-15 ENCOUNTER — Ambulatory Visit: Admission: RE | Admit: 2016-07-15 | Payer: Commercial Managed Care - PPO | Source: Ambulatory Visit

## 2016-07-15 ENCOUNTER — Inpatient Hospital Stay: Admission: RE | Admit: 2016-07-15 | Payer: Commercial Managed Care - PPO | Source: Ambulatory Visit

## 2016-07-15 ENCOUNTER — Other Ambulatory Visit: Payer: Commercial Managed Care - PPO

## 2017-02-20 ENCOUNTER — Ambulatory Visit: Payer: Commercial Managed Care - PPO | Admitting: Obstetrics and Gynecology

## 2017-05-27 ENCOUNTER — Ambulatory Visit: Payer: Commercial Managed Care - PPO | Admitting: Obstetrics and Gynecology

## 2017-06-03 ENCOUNTER — Other Ambulatory Visit: Payer: Self-pay | Admitting: Obstetrics and Gynecology

## 2017-07-26 ENCOUNTER — Other Ambulatory Visit: Payer: Self-pay | Admitting: Obstetrics and Gynecology

## 2017-07-26 DIAGNOSIS — I10 Essential (primary) hypertension: Secondary | ICD-10-CM

## 2017-07-27 NOTE — Telephone Encounter (Signed)
Please advise 

## 2017-07-27 NOTE — Telephone Encounter (Signed)
Pt scheduled AE for 08/18/17 w/SDJ. She ran out of BP meds yesterday. [facility] faxed for refill. Pt requesting a refill to last til her AE. [phone]

## 2017-07-28 NOTE — Telephone Encounter (Signed)
Patient is calling to follow up on her Medication refill. Please advise.

## 2017-08-18 ENCOUNTER — Ambulatory Visit: Payer: Self-pay | Admitting: Obstetrics and Gynecology

## 2017-08-18 ENCOUNTER — Other Ambulatory Visit: Payer: Self-pay

## 2017-08-18 MED ORDER — NORGESTIMATE-ETH ESTRADIOL 0.25-35 MG-MCG PO TABS: 1.0000 | ORAL_TABLET | Freq: Every day | ORAL | 0 refills | Status: DC

## 2017-08-18 NOTE — Telephone Encounter (Signed)
Pt aware rx sent in. 

## 2017-09-02 ENCOUNTER — Ambulatory Visit: Payer: Self-pay | Admitting: Obstetrics and Gynecology

## 2017-09-23 ENCOUNTER — Ambulatory Visit: Payer: Self-pay | Admitting: Obstetrics and Gynecology

## 2017-10-09 ENCOUNTER — Other Ambulatory Visit: Payer: Self-pay | Admitting: Obstetrics and Gynecology

## 2017-10-09 ENCOUNTER — Telehealth: Payer: Self-pay

## 2017-10-09 DIAGNOSIS — Z3041 Encounter for surveillance of contraceptive pills: Secondary | ICD-10-CM

## 2017-10-09 DIAGNOSIS — F419 Anxiety disorder, unspecified: Secondary | ICD-10-CM

## 2017-10-09 MED ORDER — ALPRAZOLAM 0.25 MG PO TABS: 0.2500 mg | ORAL_TABLET | Freq: Three times a day (TID) | ORAL | 0 refills | Status: DC | PRN

## 2017-10-09 MED ORDER — NORGESTIMATE-ETH ESTRADIOL 0.25-35 MG-MCG PO TABS: 1.0000 | ORAL_TABLET | Freq: Every day | ORAL | 0 refills | Status: DC

## 2017-10-09 NOTE — Telephone Encounter (Signed)
Both rx's sent

## 2017-10-09 NOTE — Telephone Encounter (Signed)
Pt has annual scheduled for 8/14. She would like a refill on her OCP and Xanax. CB# [phone]

## 2017-10-27 ENCOUNTER — Other Ambulatory Visit: Payer: Self-pay

## 2017-10-27 ENCOUNTER — Telehealth: Payer: Self-pay

## 2017-10-27 DIAGNOSIS — Z3041 Encounter for surveillance of contraceptive pills: Secondary | ICD-10-CM

## 2017-10-27 DIAGNOSIS — I10 Essential (primary) hypertension: Secondary | ICD-10-CM

## 2017-10-27 MED ORDER — NORGESTIMATE-ETH ESTRADIOL 0.25-35 MG-MCG PO TABS: 1.0000 | ORAL_TABLET | Freq: Every day | ORAL | 0 refills | Status: DC

## 2017-10-27 MED ORDER — HYDROCHLOROTHIAZIDE 25 MG PO TABS: 25.0000 mg | ORAL_TABLET | Freq: Every day | ORAL | 0 refills | Status: DC

## 2017-10-27 NOTE — Telephone Encounter (Signed)
IT's ok to refill both to get her to her appointment for now.  I will discuss any recommended changes at that time.  Thank you!

## 2017-10-27 NOTE — Telephone Encounter (Signed)
Pt has a appointment on sep 19th she needs a refill to last until then for [facility] and BP pills I will refill the [facility] pills, please advise on BP pills

## 2017-10-27 NOTE — Telephone Encounter (Signed)
Done

## 2017-10-28 ENCOUNTER — Ambulatory Visit: Payer: Self-pay | Admitting: Obstetrics and Gynecology

## 2017-11-05 NOTE — Telephone Encounter (Signed)
This encounter was created in error - please disregard.

## 2017-12-03 ENCOUNTER — Ambulatory Visit: Payer: Self-pay | Admitting: Obstetrics and Gynecology

## 2017-12-22 ENCOUNTER — Ambulatory Visit: Payer: Self-pay | Admitting: Obstetrics and Gynecology

## 2018-01-25 ENCOUNTER — Ambulatory Visit (INDEPENDENT_AMBULATORY_CARE_PROVIDER_SITE_OTHER): Payer: 59 | Admitting: Obstetrics and Gynecology

## 2018-01-25 VITALS — BP 118/74 | Ht 67.0 in | Wt 156.0 lb

## 2018-01-25 DIAGNOSIS — F419 Anxiety disorder, unspecified: Secondary | ICD-10-CM

## 2018-01-25 DIAGNOSIS — N751 Abscess of Bartholin's gland: Secondary | ICD-10-CM | POA: Diagnosis not present

## 2018-01-25 DIAGNOSIS — I10 Essential (primary) hypertension: Secondary | ICD-10-CM | POA: Diagnosis not present

## 2018-01-25 MED ORDER — HYDROCHLOROTHIAZIDE 25 MG PO TABS: 25.0000 mg | ORAL_TABLET | Freq: Every day | ORAL | 0 refills | Status: DC

## 2018-01-25 MED ORDER — ALPRAZOLAM 0.25 MG PO TABS: 0.2500 mg | ORAL_TABLET | Freq: Three times a day (TID) | ORAL | 0 refills | Status: DC | PRN

## 2018-01-25 NOTE — Progress Notes (Signed)
Obstetrics & Gynecology Office Visit   Chief Complaint  Patient presents with  . Pelvic Pain  . Follow-up  . Bartholin's Cyst   History of Present Illness: 47 y.o. G62P0010 female who presents in follow up from a Bartholin's Gland Abscess that was diagnosed on 01/22/18.  She had the abscess drained on 11/8 and had a Word catheter placed.  She states the catheter came out in the toilet yesterday.  She states that she has had a similar issue on the same side.  This was about 3-5 years ago and they came back-to-back.  But, much time has passed since her last one. The area is still bleeding.  The area is not as tender as before.  She denies fevers and chills.    Past Medical History:  Diagnosis Date  . Abnormal uterine bleeding   . Acute vaginitis   . Anxiety   . Hypertension   . Monilial vulvovaginitis   . Subserous leiomyoma of uterus    Past Surgical History: denies  Gynecologic History: No LMP recorded. (Menstrual status: Oral contraceptives).  Obstetric History: G1P0010  Family History  Problem Relation Age of Onset  . Colon cancer Maternal Grandfather     Social History   Socioeconomic History  . Marital status: Married    Spouse name: Not on file  . Number of children: Not on file  . Years of education: Not on file  . Highest education level: Not on file  Occupational History  . Not on file  Social Needs  . Financial resource strain: Not on file  . Food insecurity:    Worry: Not on file    Inability: Not on file  . Transportation needs:    Medical: Not on file    Non-medical: Not on file  Tobacco Use  . Smoking status: Former Smoker    Last attempt to quit: 04/04/2004    Years since quitting: 13.8  . Smokeless tobacco: Never Used  Substance and Sexual Activity  . Alcohol use: No  . Drug use: No  . Sexual activity: Yes    Birth control/protection: Pill  Lifestyle  . Physical activity:    Days per week: Not on file    Minutes per session: Not on file  .  Stress: Not on file  Relationships  . Social connections:    Talks on phone: Not on file    Gets together: Not on file    Attends religious service: Not on file    Active member of club or organization: Not on file    Attends meetings of clubs or organizations: Not on file    Relationship status: Not on file  . Intimate partner violence:    Fear of current or ex partner: Not on file    Emotionally abused: Not on file    Physically abused: Not on file    Forced sexual activity: Not on file  Other Topics Concern  . Not on file  Social History Narrative  . Not on file    No Known Allergies  Prior to Admission medications   Medication Sig Start Date End Date Taking? Authorizing Provider  ALPRAZolam (XANAX) 0.25 MG tablet Take 1 tablet (0.25 mg total) by mouth 3 (three) times daily as needed for anxiety. 10/09/17  Yes Will Bonnet, MD  hydrochlorothiazide (HYDRODIURIL) 25 MG tablet Take 1 tablet (25 mg total) by mouth daily. 10/27/17  Yes Will Bonnet, MD  norgestimate-ethinyl estradiol ([place] 28) 0.25-35 MG-MCG tablet Take 1  tablet by mouth daily. 10/27/17  Yes Will Bonnet, MD    Review of Systems  Constitutional: Negative.   HENT: Negative.   Eyes: Negative.   Respiratory: Negative.   Cardiovascular: Negative.   Gastrointestinal: Negative.   Genitourinary: Negative.        Per HPI  Musculoskeletal: Negative.   Skin: Negative.   Neurological: Negative.   Psychiatric/Behavioral: Negative.      Physical Exam BP 118/74   Ht 5\' 7"  (1.702 m)   Wt 156 lb (70.8 kg)   BMI 24.43 kg/m  No LMP recorded. (Menstrual status: Oral contraceptives). Physical Exam  Constitutional: She is oriented to person, place, and time. She appears well-developed and well-nourished. No distress.  Genitourinary: Pelvic exam was performed with patient supine.     HENT:  Head: Normocephalic and atraumatic.  Eyes: Conjunctivae are normal. No scleral icterus.  Musculoskeletal:  Normal range of motion. She exhibits no edema.  Neurological: She is alert and oriented to person, place, and time. No cranial nerve deficit.  Skin: Skin is warm and dry. No erythema.  Psychiatric: She has a normal mood and affect. Her behavior is normal. Judgment normal.    Female chaperone present for pelvic and breast  portions of the physical exam  Assessment: 47 y.o. G42P0010 female here for  1. Bartholin's gland abscess   2. Benign hypertension   3. Benign essential hypertension   4. Anxiety      Plan: Problem List Items Addressed This Visit      Cardiovascular and Mediastinum   Benign essential hypertension   Relevant Medications   hydrochlorothiazide (HYDRODIURIL) 25 MG tablet     Other   Anxiety   Relevant Medications   ALPRAZolam (XANAX) 0.25 MG tablet    Other Visit Diagnoses    Bartholin's gland abscess    -  Primary   Benign hypertension       Relevant Medications   hydrochlorothiazide (HYDRODIURIL) 25 MG tablet      Follow up prn.  Discussed next steps, if does not continue to heal. Needs annual exam asap.  Needs mammogram ASAP with finding 1.5 years ago. Small refill for BP meds and very small refill for xanax (15 tabs). No more refills unless has an appt.  15 minutes spent in face to face discussion with > 50% spent in counseling,management, and coordination of care of her bartholin's gland abscess.   Prentice Docker, MD 01/25/2018 12:29 PM

## 2018-01-26 ENCOUNTER — Telehealth: Payer: Self-pay

## 2018-01-26 NOTE — Telephone Encounter (Signed)
Let me know when pt calls me back

## 2018-01-26 NOTE — Telephone Encounter (Signed)
Pt seen SDJ yesterday, she had a cyst removed. She had intercourse last night, had some issues, needs to know if she needs to wait. Pt wants SDJ or nurse to call her

## 2018-01-26 NOTE — Telephone Encounter (Signed)
Pt returned call

## 2018-01-26 NOTE — Telephone Encounter (Signed)
Please let me know when pt cals back

## 2018-01-27 NOTE — Telephone Encounter (Signed)
Spoke with pt, advised her to wait 1-2 weeks before intercourse. Pt will call if anymore issues

## 2018-01-27 NOTE — Telephone Encounter (Signed)
Duplicate msg.

## 2018-02-22 ENCOUNTER — Telehealth: Payer: Self-pay

## 2018-02-22 NOTE — Telephone Encounter (Signed)
Pt was seen 3w ago.  States cyst has recurred.  She has appt tomorrow at 3:50 but isn't sure if can continue c procedure c period starting.  If not, can she at least have something for pain?  [phone]

## 2018-02-22 NOTE — Telephone Encounter (Signed)
Pt aware.

## 2018-02-22 NOTE — Telephone Encounter (Signed)
Pt needs to keep follow up appt with SDJ.

## 2018-02-23 ENCOUNTER — Ambulatory Visit (INDEPENDENT_AMBULATORY_CARE_PROVIDER_SITE_OTHER): Payer: 59 | Admitting: Obstetrics and Gynecology

## 2018-02-23 VITALS — BP 144/80 | Ht 67.0 in | Wt 156.0 lb

## 2018-02-23 DIAGNOSIS — N751 Abscess of Bartholin's gland: Secondary | ICD-10-CM

## 2018-02-23 DIAGNOSIS — Z3041 Encounter for surveillance of contraceptive pills: Secondary | ICD-10-CM | POA: Diagnosis not present

## 2018-02-23 MED ORDER — SULFAMETHOXAZOLE-TRIMETHOPRIM 800-160 MG PO TABS: 1.0000 | ORAL_TABLET | Freq: Two times a day (BID) | ORAL | 0 refills | Status: AC

## 2018-02-23 MED ORDER — NORGESTIMATE-ETH ESTRADIOL 0.25-35 MG-MCG PO TABS: 1.0000 | ORAL_TABLET | Freq: Every day | ORAL | 0 refills | Status: DC

## 2018-02-23 NOTE — Progress Notes (Signed)
Obstetrics & Gynecology Office Visit   Chief Complaint:  Chief Complaint  Patient presents with  . Bartholin's Cyst    History of Present Illness: 47 y.o. G63P0010 female who presents in follow-up from a Bartholin's gland abscess.  She was last seen in clinic on this on November 11 after she had presented to the emergency room where the abscess was drained.  She states that 4 days ago the pain in the abscess area became worse (the pain had completely resolved), the pain resolved yesterday, and today it is still mildly painful but not enlarged.  She adamantly declines an exam today.  Past Medical History:  Diagnosis Date  . Abnormal uterine bleeding   . Acute vaginitis   . Anxiety   . Hypertension   . Monilial vulvovaginitis   . Subserous leiomyoma of uterus     Past Surgical History:  Procedure Laterality Date  . NO PAST SURGERIES      Gynecologic History: No LMP recorded. (Menstrual status: Oral contraceptives).  Obstetric History: G1P0010  Family History  Problem Relation Age of Onset  . Colon cancer Maternal Grandfather     Social History   Socioeconomic History  . Marital status: Married    Spouse name: Not on file  . Number of children: Not on file  . Years of education: Not on file  . Highest education level: Not on file  Occupational History  . Not on file  Social Needs  . Financial resource strain: Not on file  . Food insecurity:    Worry: Not on file    Inability: Not on file  . Transportation needs:    Medical: Not on file    Non-medical: Not on file  Tobacco Use  . Smoking status: Former Smoker    Last attempt to quit: 04/04/2004    Years since quitting: 13.9  . Smokeless tobacco: Never Used  Substance and Sexual Activity  . Alcohol use: No  . Drug use: No  . Sexual activity: Yes    Birth control/protection: Pill  Lifestyle  . Physical activity:    Days per week: Not on file    Minutes per session: Not on file  . Stress: Not on file    Relationships  . Social connections:    Talks on phone: Not on file    Gets together: Not on file    Attends religious service: Not on file    Active member of club or organization: Not on file    Attends meetings of clubs or organizations: Not on file    Relationship status: Not on file  . Intimate partner violence:    Fear of current or ex partner: Not on file    Emotionally abused: Not on file    Physically abused: Not on file    Forced sexual activity: Not on file  Other Topics Concern  . Not on file  Social History Narrative  . Not on file    No Known Allergies  Prior to Admission medications   Medication Sig Start Date End Date Taking? Authorizing Provider  ALPRAZolam (XANAX) 0.25 MG tablet Take 1 tablet (0.25 mg total) by mouth 3 (three) times daily as needed for anxiety. 01/25/18  Yes Will Bonnet, MD  hydrochlorothiazide (HYDRODIURIL) 25 MG tablet Take 1 tablet (25 mg total) by mouth daily. 01/25/18  Yes Will Bonnet, MD  norgestimate-ethinyl estradiol ([place] 28) 0.25-35 MG-MCG tablet Take 1 tablet by mouth daily. 10/27/17  Yes Will Bonnet, MD  Review of Systems  Constitutional: Negative.   HENT: Negative.   Eyes: Negative.   Respiratory: Negative.   Cardiovascular: Negative.   Gastrointestinal: Negative.   Genitourinary: Negative.   Musculoskeletal: Negative.   Skin: Negative.   Neurological: Negative.   Psychiatric/Behavioral: Negative.      Physical Exam BP (!) 144/80   Ht 5\' 7"  (1.702 m)   Wt 156 lb (70.8 kg)   BMI 24.43 kg/m  No LMP recorded. (Menstrual status: Oral contraceptives). Physical Exam Constitutional:      General: She is not in acute distress.    Appearance: Normal appearance.  HENT:     Head: Normocephalic and atraumatic.  Eyes:     General: No scleral icterus.    Conjunctiva/sclera: Conjunctivae normal.  Neurological:     General: No focal deficit present.     Mental Status: She is alert and oriented to  person, place, and time.     Cranial Nerves: No cranial nerve deficit.  Psychiatric:        Mood and Affect: Mood normal.        Behavior: Behavior normal.        Thought Content: Thought content normal.     Female chaperone present for pelvic and breast  portions of the physical exam  Assessment: 47 y.o. G58P0010 female here for  1. Bartholin's gland abscess   2. Oral contraceptive use      Plan: Problem List Items Addressed This Visit      Genitourinary   Bartholin's gland abscess - Primary   Relevant Medications   sulfamethoxazole-trimethoprim (BACTRIM DS) 800-160 MG tablet     Other   Oral contraceptive use   Relevant Medications   norgestimate-ethinyl estradiol (SPRINTEC 28) 0.25-35 MG-MCG tablet     Bartholin's gland abscess: Discussed ongoing treatment of this issue.  Discussed that it is a complex treatment issue given that surgical treatment options are really only available while the abscess is inflamed and enlarged.  Her abscess is currently not enlarged and only mildly inflamed.  It likely has drained spontaneously.  Discussed treatment options, which include; do nothing at this time and see if the abscess recurs, treatment with antibiotics to further decrease inflammation, surgical management to see if marsupialization can be attempted.  I strongly discouraged marsupialization while the gland is not enlarged and inflamed, as locating the gland is difficult under the circumstance and surgical deformity is a high risk in this circumstance.  She elects antibiotic treatment to reduce risk of recurrence.  15 minutes spent in face to face discussion with > 50% spent in counseling,management, and coordination of care of her Bartholing's Gland Abscess.   Prentice Docker, MD 02/23/2018 4:48 PM

## 2018-02-28 ENCOUNTER — Encounter: Payer: Self-pay | Admitting: Obstetrics and Gynecology

## 2018-03-22 ENCOUNTER — Other Ambulatory Visit: Payer: Self-pay

## 2018-03-22 DIAGNOSIS — Z3041 Encounter for surveillance of contraceptive pills: Secondary | ICD-10-CM

## 2018-03-22 MED ORDER — NORGESTIMATE-ETH ESTRADIOL 0.25-35 MG-MCG PO TABS: 1.0000 | ORAL_TABLET | Freq: Every day | ORAL | 0 refills | Status: DC

## 2018-03-31 ENCOUNTER — Ambulatory Visit: Payer: 59 | Admitting: Obstetrics and Gynecology

## 2018-04-19 ENCOUNTER — Other Ambulatory Visit: Payer: Self-pay | Admitting: Obstetrics and Gynecology

## 2018-04-19 DIAGNOSIS — I10 Essential (primary) hypertension: Secondary | ICD-10-CM

## 2018-04-19 NOTE — Telephone Encounter (Signed)
Cancelled annual exam 03/31/2018. Last seen 02/2018. Please advise

## 2018-04-20 ENCOUNTER — Other Ambulatory Visit: Payer: Self-pay | Admitting: Obstetrics and Gynecology

## 2018-04-20 DIAGNOSIS — I10 Essential (primary) hypertension: Secondary | ICD-10-CM

## 2018-04-21 NOTE — Telephone Encounter (Signed)
advise

## 2018-05-24 ENCOUNTER — Telehealth: Payer: Self-pay

## 2018-05-24 NOTE — Telephone Encounter (Signed)
Pt needs a follow up appt before more will be sent in. Tried to call pt. No answer.

## 2018-05-24 NOTE — Telephone Encounter (Signed)
Pt calling for refill on xanax.  [phone]

## 2018-05-25 NOTE — Telephone Encounter (Signed)
Pt will call to get follow up scheduled

## 2018-06-11 ENCOUNTER — Telehealth: Payer: Self-pay

## 2018-06-11 NOTE — Telephone Encounter (Signed)
Pt calling for xanax refill.  Not sure she if she can get in for appt.  [phone]

## 2018-06-14 ENCOUNTER — Other Ambulatory Visit: Payer: Self-pay | Admitting: Obstetrics and Gynecology

## 2018-06-14 DIAGNOSIS — F419 Anxiety disorder, unspecified: Secondary | ICD-10-CM

## 2018-06-14 MED ORDER — ALPRAZOLAM 0.25 MG PO TABS
0.2500 mg | ORAL_TABLET | Freq: Three times a day (TID) | ORAL | 0 refills | Status: DC | PRN
Start: 2018-06-14 — End: 2018-07-30

## 2018-06-14 NOTE — Telephone Encounter (Signed)
Please advise 

## 2018-06-14 NOTE — Telephone Encounter (Signed)
Rx sent in. Refill from 01/2018.

## 2018-06-15 NOTE — Telephone Encounter (Signed)
Pt aware via vm 

## 2018-06-21 ENCOUNTER — Telehealth: Payer: Self-pay

## 2018-06-21 NOTE — Telephone Encounter (Signed)
Pt states the cyst in her vagina has returned. Inquiring if she needs to be seen or if something should be called in. [phone]

## 2018-06-22 ENCOUNTER — Other Ambulatory Visit: Payer: Self-pay | Admitting: Obstetrics and Gynecology

## 2018-06-22 DIAGNOSIS — N751 Abscess of Bartholin's gland: Secondary | ICD-10-CM

## 2018-06-22 MED ORDER — SULFAMETHOXAZOLE-TRIMETHOPRIM 800-160 MG PO TABS: 1.0000 | ORAL_TABLET | Freq: Two times a day (BID) | ORAL | 0 refills | Status: AC

## 2018-06-22 NOTE — Telephone Encounter (Signed)
I will call her something in for now. If this doesn't resolve it, she'll need an exam. Would you mind letting her know? Thanks!

## 2018-06-24 NOTE — Telephone Encounter (Signed)
VM Full. Unable to leave message to verify if patient picked up rx.

## 2018-07-01 ENCOUNTER — Other Ambulatory Visit: Payer: Self-pay | Admitting: Obstetrics and Gynecology

## 2018-07-01 DIAGNOSIS — Z3041 Encounter for surveillance of contraceptive pills: Secondary | ICD-10-CM

## 2018-07-06 ENCOUNTER — Telehealth: Payer: Self-pay

## 2018-07-06 NOTE — Telephone Encounter (Signed)
Pt calling triage line stating she needs a refill on her OCP.     SDJ, I do not see where she has been in for an annual exam. She has cancelled or no showed quite a few. Please advise on OCP refill. KJ CMA

## 2018-07-08 NOTE — Telephone Encounter (Signed)
Pt f/u on OCP refill request from 3 days ago. She is completely out. SDJ is her provider.

## 2018-07-09 ENCOUNTER — Other Ambulatory Visit: Payer: Self-pay | Admitting: Obstetrics and Gynecology

## 2018-07-09 DIAGNOSIS — Z3041 Encounter for surveillance of contraceptive pills: Secondary | ICD-10-CM

## 2018-07-09 NOTE — Telephone Encounter (Signed)
Let's arrange a phone-call visit to discuss birth control.  Her BP issue concerns me with her having this type of birth control. thanks

## 2018-07-09 NOTE — Telephone Encounter (Signed)
Please get appt sheduled

## 2018-07-09 NOTE — Telephone Encounter (Signed)
Patient needs at least a phone appointment to discuss.

## 2018-07-09 NOTE — Telephone Encounter (Signed)
Please advise, there is also a Mychart msg since Wednesday

## 2018-07-12 ENCOUNTER — Other Ambulatory Visit: Payer: Self-pay

## 2018-07-12 DIAGNOSIS — Z3041 Encounter for surveillance of contraceptive pills: Secondary | ICD-10-CM

## 2018-07-12 MED ORDER — NORGESTIMATE-ETH ESTRADIOL 0.25-35 MG-MCG PO TABS: 1.0000 | ORAL_TABLET | Freq: Every day | ORAL | 0 refills | Status: DC

## 2018-07-12 NOTE — Telephone Encounter (Signed)
Please schedule phone visit.

## 2018-07-12 NOTE — Telephone Encounter (Signed)
Voicemail box is full - unable to leave message

## 2018-07-12 NOTE — Telephone Encounter (Signed)
Voicemail box is full unable to leave voicemail for patient to call back to be schedule telephone visit

## 2018-07-20 ENCOUNTER — Ambulatory Visit (INDEPENDENT_AMBULATORY_CARE_PROVIDER_SITE_OTHER): Payer: Commercial Managed Care - PPO | Admitting: Obstetrics and Gynecology

## 2018-07-20 ENCOUNTER — Encounter: Payer: Self-pay | Admitting: Obstetrics and Gynecology

## 2018-07-20 ENCOUNTER — Other Ambulatory Visit: Payer: Self-pay

## 2018-07-20 DIAGNOSIS — Z3041 Encounter for surveillance of contraceptive pills: Secondary | ICD-10-CM | POA: Diagnosis not present

## 2018-07-20 MED ORDER — NORETHINDRONE 0.35 MG PO TABS: 1.0000 | ORAL_TABLET | Freq: Every day | ORAL | 11 refills | Status: DC

## 2018-07-20 NOTE — Progress Notes (Signed)
    Virtual Visit via Telephone Note  I connected with Katherine Fuentes on 07/20/18 at  3:30 PM EDT by telephone and verified that I am speaking with the correct person using two identifiers.   I discussed the limitations, risks, security and privacy concerns of performing an evaluation and management service by telephone and the availability of in person appointments. I also discussed with the patient that there may be a patient responsible charge related to this service. The patient expressed understanding and agreed to proceed.  The patient was at home I spoke with the patient from my office The names of people involved in this encounter were: Katherine Fuentes and Prentice Docker, MD.   History of Present Illness: 48 y.o. G28P0010 female who would like a refill on her medication.   Patient is a 48 y.o. G1P0010 presenting for contraception consult.  She is currently on OCP (estrogen/progesterone) and desiring to start another, non-estrogen form of contraception.  She has a past medical history significant for chronic hypertension.  She specifically denies a history of migraine with aura, history of DVT/PE and smoking.  Reported No LMP recorded. (Menstrual status: Oral contraceptives)..       Observations/Objective: Physical Exam could not be performed. Because of the COVID-19 outbreak this visit was performed over the phone and not in person.   Assessment and Plan: Problem List Items Addressed This Visit    None    Visit Diagnoses    Encounter for surveillance of contraceptive pills    -  Primary   Relevant Medications   norethindrone (MICRONOR) 0.35 MG tablet      Reviewed all forms of birth control options available including abstinence; over the counter/barrier methods; hormonal contraceptive medication including pill, patch, ring, injection,contraceptive implant; hormonal and nonhormonal IUDs; permanent sterilization options including vasectomy and the various tubal  sterilization modalities. Risks and benefits reviewed.  Questions were answered.  Information was given to patient to review. She would like to try the progesterone-only contraceptive pills.   Follow Up Instructions: Start taking this medication after 5 days of placebo in your current pack.     I discussed the assessment and treatment plan with the patient. The patient was provided an opportunity to ask questions and all were answered. The patient agreed with the plan and demonstrated an understanding of the instructions.   The patient was advised to call back or seek an in-person evaluation if the symptoms worsen or if the condition fails to improve as anticipated.  I provided 21 minutes of non-face-to-face time during this encounter.  Return in about 2 months (around 09/19/2018) for Annual Gynecologic Examination.   Prentice Docker, MD  [facility] 07/20/2018 3:59 PM

## 2018-07-26 ENCOUNTER — Telehealth: Payer: Self-pay

## 2018-07-26 NOTE — Telephone Encounter (Signed)
Pt needs refills on blood pressure med and xanax.  [phone]

## 2018-07-28 ENCOUNTER — Other Ambulatory Visit: Payer: Self-pay | Admitting: Obstetrics and Gynecology

## 2018-07-28 DIAGNOSIS — F419 Anxiety disorder, unspecified: Secondary | ICD-10-CM

## 2018-07-28 DIAGNOSIS — I10 Essential (primary) hypertension: Secondary | ICD-10-CM

## 2018-07-29 NOTE — Telephone Encounter (Signed)
Pt was seen on 5/5, Please advise

## 2018-07-30 NOTE — Telephone Encounter (Signed)
Looks like Dr Glennon Mac sent the refills today.

## 2018-08-25 ENCOUNTER — Other Ambulatory Visit: Payer: Self-pay

## 2018-08-25 ENCOUNTER — Encounter: Payer: Self-pay | Admitting: Obstetrics and Gynecology

## 2018-08-25 ENCOUNTER — Ambulatory Visit: Payer: Commercial Managed Care - PPO | Admitting: Obstetrics and Gynecology

## 2018-08-25 VITALS — BP 128/88 | Ht 66.0 in | Wt 156.0 lb

## 2018-08-25 DIAGNOSIS — N3001 Acute cystitis with hematuria: Secondary | ICD-10-CM | POA: Diagnosis not present

## 2018-08-25 LAB — POCT URINALYSIS DIPSTICK
Bilirubin, UA: NEGATIVE
Glucose, UA: NEGATIVE
Nitrite, UA: NEGATIVE
Protein, UA: POSITIVE — AB
Spec Grav, UA: 1.025 (ref 1.010–1.025)
Urobilinogen, UA: NEGATIVE E.U./dL — AB
pH, UA: 7 (ref 5.0–8.0)

## 2018-08-25 MED ORDER — SULFAMETHOXAZOLE-TRIMETHOPRIM 800-160 MG PO TABS: 1.0000 | ORAL_TABLET | Freq: Two times a day (BID) | ORAL | 0 refills | Status: AC

## 2018-08-25 NOTE — Progress Notes (Signed)
Obstetrics & Gynecology Office Visit   Chief Complaint  Patient presents with  . Urinary Tract Infection    pressure upon urination, urine has odor  . Back Pain   History of Present Illness: Urinary Tract Infection: Patient complains of left flank pain, foul smelling urine, incomplete bladder emptying and suprapubic pressure She has had symptoms for 3 days. Patient also complains of back pain. Patient denies fever and vaginal discharge. Patient does not have a history of recurrent UTI.  Patient does not have a history of pyelonephritis.    Past Medical History:  Diagnosis Date  . Abnormal uterine bleeding   . Acute vaginitis   . Anxiety   . Hypertension   . Monilial vulvovaginitis   . Subserous leiomyoma of uterus     Past Surgical History:  Procedure Laterality Date  . DILATION AND CURETTAGE OF UTERUS      Gynecologic History: No LMP recorded. (Menstrual status: Oral contraceptives).  Obstetric History: G1P0010  Family History  Problem Relation Age of Onset  . Colon cancer Maternal Grandfather     Social History   Socioeconomic History  . Marital status: Married    Spouse name: Not on file  . Number of children: Not on file  . Years of education: Not on file  . Highest education level: Not on file  Occupational History  . Not on file  Social Needs  . Financial resource strain: Not on file  . Food insecurity:    Worry: Not on file    Inability: Not on file  . Transportation needs:    Medical: Not on file    Non-medical: Not on file  Tobacco Use  . Smoking status: Former Smoker    Last attempt to quit: 04/04/2004    Years since quitting: 14.4  . Smokeless tobacco: Never Used  Substance and Sexual Activity  . Alcohol use: No  . Drug use: No  . Sexual activity: Yes    Birth control/protection: Pill  Lifestyle  . Physical activity:    Days per week: Not on file    Minutes per session: Not on file  . Stress: Not on file  Relationships  . Social  connections:    Talks on phone: Not on file    Gets together: Not on file    Attends religious service: Not on file    Active member of club or organization: Not on file    Attends meetings of clubs or organizations: Not on file    Relationship status: Not on file  . Intimate partner violence:    Fear of current or ex partner: Not on file    Emotionally abused: Not on file    Physically abused: Not on file    Forced sexual activity: Not on file  Other Topics Concern  . Not on file  Social History Narrative  . Not on file    No Known Allergies  Prior to Admission medications   Medication Sig Start Date End Date Taking? Authorizing Provider  ALPRAZolam Duanne Moron) 0.25 MG tablet TAKE 1 TABLET BY MOUTH THREE TIMES DAILY AS NEEDED FOR ANXIETY 07/30/18  Yes Will Bonnet, MD  hydrochlorothiazide (HYDRODIURIL) 25 MG tablet Take 1 tablet by mouth once daily 07/30/18  Yes Will Bonnet, MD  norethindrone (MICRONOR) 0.35 MG tablet Take 1 tablet (0.35 mg total) by mouth daily. 07/20/18  Yes Will Bonnet, MD    Review of Systems  Constitutional: Negative.   HENT: Negative.   Eyes:  Negative.   Respiratory: Negative.   Cardiovascular: Negative.   Gastrointestinal: Negative.   Genitourinary: Negative.   Musculoskeletal: Negative.   Skin: Negative.   Neurological: Negative.   Psychiatric/Behavioral: Negative.      Physical Exam BP 128/88 (BP Location: Left Arm, Patient Position: Sitting, Cuff Size: Normal)   Ht 5\' 6"  (1.676 m)   Wt 156 lb (70.8 kg)   BMI 25.18 kg/m  No LMP recorded. (Menstrual status: Oral contraceptives). Physical Exam Constitutional:      General: She is not in acute distress.    Appearance: Normal appearance. She is well-developed.  HENT:     Head: Normocephalic and atraumatic.  Eyes:     General: No scleral icterus.    Conjunctiva/sclera: Conjunctivae normal.  Neck:     Musculoskeletal: Normal range of motion and neck supple.  Cardiovascular:      Rate and Rhythm: Normal rate and regular rhythm.     Heart sounds: No murmur. No friction rub. No gallop.   Pulmonary:     Effort: Pulmonary effort is normal. No respiratory distress.     Breath sounds: Normal breath sounds. No wheezing or rales.  Abdominal:     General: Bowel sounds are normal. There is no distension.     Palpations: Abdomen is soft. There is no mass.     Tenderness: There is no abdominal tenderness. There is no right CVA tenderness, left CVA tenderness, guarding or rebound.  Musculoskeletal: Normal range of motion.  Neurological:     General: No focal deficit present.     Mental Status: She is alert and oriented to person, place, and time.     Cranial Nerves: No cranial nerve deficit.  Skin:    General: Skin is warm and dry.     Findings: No erythema.  Psychiatric:        Mood and Affect: Mood normal.        Behavior: Behavior normal.        Judgment: Judgment normal.     Urinalysis: Notable for -  Ketones: 4+ Blood: large Protein: Positive pH: 7.0 Nitrite: negative Leukocytes: Large (3+)  Assessment: 48 y.o. G76P0010 female here for  1. Acute cystitis with hematuria      Plan: Problem List Items Addressed This Visit    None    Visit Diagnoses    Acute cystitis with hematuria    -  Primary   Relevant Medications   sulfamethoxazole-trimethoprim (BACTRIM DS) 800-160 MG tablet   Other Relevant Orders   POCT urinalysis dipstick (Completed)   Urine Culture     Symptoms consistent with UTI and urine dip highly suggestive.  Will send urine for culture.  Rx provided for Bactrim. No evidence of pyelonephritis today. However, given some left flank pain, discussed ureterolithiasis and will treat for 5 days instead of 3.    15 minutes spent in face to face discussion with > 50% spent in counseling,management, and coordination of care of her acute cystitis with hematuria.   Prentice Docker, MD 08/25/2018 9:44 AM

## 2018-08-27 LAB — URINE CULTURE

## 2018-10-18 ENCOUNTER — Telehealth: Payer: Self-pay

## 2018-10-18 NOTE — Telephone Encounter (Signed)
Pt called triage line stating she needs a refill on her antibiotic given by SDJ for the Bartholin cyst. It has came back. CB# [phone]

## 2018-10-26 NOTE — Telephone Encounter (Signed)
Does she still have this issue? It has been a week.

## 2018-10-27 NOTE — Telephone Encounter (Signed)
Please let me know when pt calls me back. I always have a hard time getting in touch with her

## 2018-10-29 ENCOUNTER — Other Ambulatory Visit: Payer: Self-pay | Admitting: Obstetrics and Gynecology

## 2018-10-29 DIAGNOSIS — I10 Essential (primary) hypertension: Secondary | ICD-10-CM

## 2019-02-06 ENCOUNTER — Other Ambulatory Visit: Payer: Self-pay | Admitting: Obstetrics and Gynecology

## 2019-02-06 DIAGNOSIS — I10 Essential (primary) hypertension: Secondary | ICD-10-CM

## 2019-02-07 NOTE — Telephone Encounter (Signed)
Please advise 

## 2019-04-26 ENCOUNTER — Telehealth: Payer: Self-pay

## 2019-04-26 NOTE — Telephone Encounter (Signed)
Please schedule annual. Pt needs this scheduled for this refill. I have told her before. Thank you. Please let me know when you get in touch with her, she did not answer my call.

## 2019-04-26 NOTE — Telephone Encounter (Signed)
Pt would like a refill on her xanax

## 2019-04-29 ENCOUNTER — Other Ambulatory Visit: Payer: Self-pay | Admitting: Obstetrics & Gynecology

## 2019-04-29 DIAGNOSIS — F419 Anxiety disorder, unspecified: Secondary | ICD-10-CM

## 2019-04-29 DIAGNOSIS — I10 Essential (primary) hypertension: Secondary | ICD-10-CM

## 2019-04-29 MED ORDER — HYDROCHLOROTHIAZIDE 25 MG PO TABS: 25.0000 mg | ORAL_TABLET | Freq: Every day | ORAL | 0 refills | Status: DC

## 2019-04-29 MED ORDER — ALPRAZOLAM 0.25 MG PO TABS: 0.2500 mg | ORAL_TABLET | Freq: Three times a day (TID) | ORAL | 0 refills | Status: DC | PRN

## 2019-04-29 NOTE — Telephone Encounter (Signed)
SDJ out of office. Can you please send in a small refill (per SDJ this was okay). Thank you

## 2019-04-29 NOTE — Telephone Encounter (Signed)
Please advise 

## 2019-04-29 NOTE — Telephone Encounter (Signed)
Attempt to reach patient voicemail box is full unable to leave voicemail to call back to be schedule

## 2019-04-29 NOTE — Progress Notes (Signed)
Request for refills for xanax and hctz Dr Glennon Mac has prescribed in past and has relationship w patient Notes support this relationship As he is out of town for 10 days, will authorize brief refill Rx  He will discuss further with her at her upcoming appointment  Barnett Applebaum, MD, [place] 04/29/2019  4:30 PM

## 2019-04-29 NOTE — Telephone Encounter (Signed)
Patient is scheduled for her annual 3/25 with SDJ/MB.  She needs a refill on xanax as well as her BP medicine.  Preferred pharmacy [facility] [place].

## 2019-06-09 ENCOUNTER — Ambulatory Visit: Payer: Commercial Managed Care - PPO | Admitting: Obstetrics and Gynecology

## 2019-07-26 ENCOUNTER — Other Ambulatory Visit: Payer: Self-pay | Admitting: Obstetrics and Gynecology

## 2019-07-26 ENCOUNTER — Telehealth: Payer: Self-pay

## 2019-07-26 DIAGNOSIS — N751 Abscess of Bartholin's gland: Secondary | ICD-10-CM

## 2019-07-26 MED ORDER — SULFAMETHOXAZOLE-TRIMETHOPRIM 800-160 MG PO TABS: 1.0000 | ORAL_TABLET | Freq: Two times a day (BID) | ORAL | 1 refills | Status: DC

## 2019-07-26 NOTE — Telephone Encounter (Signed)
Rx sent in to pharmacy of record.

## 2019-07-26 NOTE — Telephone Encounter (Signed)
Pt is calling triage line stating the Bartholin cyst has returned. Front desk states there are no openings until next Tuesday. Can she get an antibiotic called in?

## 2019-07-28 NOTE — Telephone Encounter (Signed)
Pt aware via vm 

## 2019-08-16 ENCOUNTER — Other Ambulatory Visit: Payer: Self-pay

## 2019-08-16 ENCOUNTER — Telehealth: Payer: Self-pay

## 2019-08-16 DIAGNOSIS — Z3041 Encounter for surveillance of contraceptive pills: Secondary | ICD-10-CM

## 2019-08-16 MED ORDER — NORETHINDRONE 0.35 MG PO TABS: 1.0000 | ORAL_TABLET | Freq: Every day | ORAL | 11 refills | Status: DC

## 2019-08-16 NOTE — Telephone Encounter (Signed)
Xanax needs to be approved by Dr Glennon Mac as indicated in last note and refill request. Sch tele appt w Glennon Mac upon his return

## 2019-08-16 NOTE — Telephone Encounter (Signed)
Attempt to reach patient to schedule phone via. Voicemail is not accepting messages at this time. Unable to leave message for patient to call back to be scheduled

## 2019-08-16 NOTE — Telephone Encounter (Signed)
Pt called triage wanting to know if you can refill her Bp meds and xanax I will refill her BC. Please advise or does she need a PCP

## 2019-08-31 ENCOUNTER — Telehealth: Payer: Self-pay

## 2019-08-31 NOTE — Telephone Encounter (Signed)
Pt calling for refill of xanax and BP med.  [phone]

## 2019-09-02 NOTE — Telephone Encounter (Signed)
Patient will need to schedule annual. She is due. thanks

## 2019-09-05 NOTE — Telephone Encounter (Signed)
Tried calling pt. VM not set up

## 2019-09-06 NOTE — Telephone Encounter (Signed)
Spoke w/patient. Advised of need to schedule AE. Transferred to front desk for scheduling.

## 2019-09-06 NOTE — Telephone Encounter (Signed)
Patient states she is out of all of her medicines. [facility] says she has no more refills. [phone]

## 2019-09-06 NOTE — Telephone Encounter (Signed)
This encounter was created in error - please disregard.

## 2019-09-06 NOTE — Telephone Encounter (Signed)
Pt scheduled for 7/15.

## 2019-09-07 NOTE — Telephone Encounter (Signed)
Pt calling; has scheduled annual for 7/15; states is out of all her medications; surely she won't have to wait that long for refills.  [phone]

## 2019-09-13 ENCOUNTER — Other Ambulatory Visit: Payer: Self-pay | Admitting: Obstetrics and Gynecology

## 2019-09-13 DIAGNOSIS — I10 Essential (primary) hypertension: Secondary | ICD-10-CM

## 2019-09-13 DIAGNOSIS — F419 Anxiety disorder, unspecified: Secondary | ICD-10-CM

## 2019-09-13 MED ORDER — HYDROCHLOROTHIAZIDE 25 MG PO TABS: 25.0000 mg | ORAL_TABLET | Freq: Every day | ORAL | 0 refills | Status: DC

## 2019-09-13 MED ORDER — ALPRAZOLAM 0.25 MG PO TABS: 0.2500 mg | ORAL_TABLET | Freq: Three times a day (TID) | ORAL | 0 refills | Status: DC | PRN

## 2019-09-13 NOTE — Telephone Encounter (Signed)
Mailbox is full.  Rx'd are filled to get her to her appt.

## 2019-09-13 NOTE — Telephone Encounter (Signed)
I gave her a small amount to get her to her annual.

## 2019-09-14 NOTE — Telephone Encounter (Signed)
Patient aware prescription was sent to pharmacy to get to her scheduled appointment

## 2019-09-14 NOTE — Telephone Encounter (Signed)
Mailbox is still full

## 2019-09-29 ENCOUNTER — Ambulatory Visit: Payer: Commercial Managed Care - PPO | Admitting: Obstetrics and Gynecology

## 2019-11-03 ENCOUNTER — Ambulatory Visit: Payer: Commercial Managed Care - PPO | Admitting: Obstetrics and Gynecology

## 2019-12-01 ENCOUNTER — Ambulatory Visit: Payer: Commercial Managed Care - PPO | Admitting: Obstetrics and Gynecology

## 2019-12-27 ENCOUNTER — Telehealth: Payer: Self-pay

## 2019-12-27 NOTE — Telephone Encounter (Signed)
Pt called for refill on all medications. She has not been seen in over a year. Has annual 10/14. Tried to call pt to let her know that we cannot refill meds until she comes in for appt. Please let her know when she calls back.

## 2019-12-29 ENCOUNTER — Ambulatory Visit: Payer: Commercial Managed Care - PPO | Admitting: Obstetrics and Gynecology

## 2020-01-03 ENCOUNTER — Telehealth: Payer: Self-pay

## 2020-01-03 NOTE — Telephone Encounter (Signed)
Pt called triage needing a refill on her BP meds and xanax.

## 2020-01-04 ENCOUNTER — Other Ambulatory Visit: Payer: Self-pay | Admitting: Obstetrics and Gynecology

## 2020-01-04 DIAGNOSIS — I10 Essential (primary) hypertension: Secondary | ICD-10-CM

## 2020-01-04 NOTE — Telephone Encounter (Signed)
I was going to refill the BP medication at least but the Rx has a note o the pharmacy saying no refills after this unless she has an appointment so she should have been aware

## 2020-01-04 NOTE — Telephone Encounter (Signed)
Pt aware. She said she is scheduled in December. Was there anything sooner?

## 2020-01-04 NOTE — Telephone Encounter (Signed)
Pt aware SDJ not in office this week. Also aware we cannot refill these meds since she has not been seen for annual and did not come for her annual 10/14. Pt wanted me to see if another provider would send these in. I told her I would ask, but no promises since she sees [place] and not up to date on annual . Please advise.

## 2020-01-04 NOTE — Telephone Encounter (Signed)
Attempted to reach patient to schedule an medication follow up. Voicemail box is full unable to leave message

## 2020-02-15 ENCOUNTER — Other Ambulatory Visit (HOSPITAL_COMMUNITY)
Admission: RE | Admit: 2020-02-15 | Discharge: 2020-02-15 | Disposition: A | Discharge status: Home / Self Care | Payer: Commercial Managed Care - PPO | Source: Ambulatory Visit | Attending: Obstetrics and Gynecology | Admitting: Obstetrics and Gynecology

## 2020-02-15 ENCOUNTER — Ambulatory Visit (INDEPENDENT_AMBULATORY_CARE_PROVIDER_SITE_OTHER): Payer: Commercial Managed Care - PPO | Admitting: Obstetrics and Gynecology

## 2020-02-15 ENCOUNTER — Encounter: Payer: Self-pay | Admitting: Obstetrics and Gynecology

## 2020-02-15 ENCOUNTER — Other Ambulatory Visit: Payer: Self-pay

## 2020-02-15 VITALS — BP 164/104 | HR 113 | Resp 99 | Ht 66.0 in | Wt 157.8 lb

## 2020-02-15 DIAGNOSIS — Z1331 Encounter for screening for depression: Secondary | ICD-10-CM | POA: Diagnosis not present

## 2020-02-15 DIAGNOSIS — Z124 Encounter for screening for malignant neoplasm of cervix: Secondary | ICD-10-CM

## 2020-02-15 DIAGNOSIS — Z3041 Encounter for surveillance of contraceptive pills: Secondary | ICD-10-CM

## 2020-02-15 DIAGNOSIS — Z1211 Encounter for screening for malignant neoplasm of colon: Secondary | ICD-10-CM

## 2020-02-15 DIAGNOSIS — Z01419 Encounter for gynecological examination (general) (routine) without abnormal findings: Secondary | ICD-10-CM | POA: Diagnosis not present

## 2020-02-15 DIAGNOSIS — F419 Anxiety disorder, unspecified: Secondary | ICD-10-CM

## 2020-02-15 DIAGNOSIS — Z1339 Encounter for screening examination for other mental health and behavioral disorders: Secondary | ICD-10-CM | POA: Diagnosis not present

## 2020-02-15 DIAGNOSIS — I10 Essential (primary) hypertension: Secondary | ICD-10-CM

## 2020-02-15 MED ORDER — ALPRAZOLAM 0.25 MG PO TABS: 0.2500 mg | ORAL_TABLET | Freq: Three times a day (TID) | ORAL | 0 refills | Status: DC | PRN

## 2020-02-15 MED ORDER — NORETHINDRONE 0.35 MG PO TABS: 1.0000 | ORAL_TABLET | Freq: Every day | ORAL | 4 refills | Status: AC

## 2020-02-15 MED ORDER — HYDROCHLOROTHIAZIDE 25 MG PO TABS: 25.0000 mg | ORAL_TABLET | Freq: Every day | ORAL | 0 refills | Status: AC

## 2020-02-15 NOTE — Progress Notes (Signed)
Gynecology Annual Exam  PCP: Patient, No Pcp Per  Chief Complaint  Patient presents with  . Gynecologic Exam    annual exam   History of Present Illness:  [name] is a 49 y.o. G1P0010 who LMP was No LMP recorded. (Menstrual status: Oral contraceptives)., presents today for her annual examination.  Her menses are infrequent with spotting. She is taking norethindrone 0.35 mg.   She does not have vasomotor sx. She uses no meds.  She is sexually active. She does not have vaginal dryness.  Last Pap: 06/2016.  Results were: LGSIL, HPV negative (3 year follow up per ASCCP) Hx of STDs: none.  Last mammogram: 11/17/2012  Results were: BiRads 0. No follow up imaging by patient. There is no FH of breast cancer. There is no FH of ovarian cancer. The patient does not do self-breast exams.  Colonoscopy: has never had DEXA: has not been screened for osteoporosis  Tobacco use: former smoker (quit age 51). Alcohol use: moderate use Exercise: not active  The patient wears seatbelts: yes.     She has set an appointment for 02/29/2020 with Dr. Army Melia to establish care with a PCP.  She continues to have anxiety. She hasn't taken any medication for anxiety in a while (she hasn't had a prescription in a while).  Past Medical History:  Diagnosis Date  . Abnormal uterine bleeding   . Acute vaginitis   . Anxiety   . Hypertension   . Monilial vulvovaginitis   . Subserous leiomyoma of uterus     Past Surgical History:  Procedure Laterality Date  . DILATION AND CURETTAGE OF UTERUS      Prior to Admission medications   Medication Sig Start Date End Date Taking? Authorizing Provider  ALPRAZolam (XANAX) 0.25 MG tablet Take 1 tablet (0.25 mg total) by mouth 3 (three) times daily as needed. for anxiety 09/13/19   Will Bonnet, MD  hydrochlorothiazide (HYDRODIURIL) 25 MG tablet Take 1 tablet (25 mg total) by mouth daily. 09/13/19   Will Bonnet, MD  norethindrone (MICRONOR)  0.35 MG tablet Take 1 tablet (0.35 mg total) by mouth daily. 08/16/19   Gae Dry, MD   Allergies: No Known Allergies  Obstetric History: G1P0010  Family History  Problem Relation Age of Onset  . Colon cancer Maternal Grandfather     Social History   Socioeconomic History  . Marital status: Married    Spouse name: Not on file  . Number of children: Not on file  . Years of education: Not on file  . Highest education level: Not on file  Occupational History  . Not on file  Tobacco Use  . Smoking status: Former Smoker    Quit date: 04/04/2004    Years since quitting: 15.8  . Smokeless tobacco: Never Used  Substance and Sexual Activity  . Alcohol use: No  . Drug use: No  . Sexual activity: Yes    Birth control/protection: Pill  Other Topics Concern  . Not on file  Social History Narrative  . Not on file   Social Determinants of Health   Financial Resource Strain:   . Difficulty of Paying Living Expenses: Not on file  Food Insecurity:   . Worried About Charity fundraiser in the Last Year: Not on file  . Ran Out of Food in the Last Year: Not on file  Transportation Needs:   . Lack of Transportation (Medical): Not on file  . Lack of Transportation (Non-Medical):  Not on file  Physical Activity:   . Days of Exercise per Week: Not on file  . Minutes of Exercise per Session: Not on file  Stress:   . Feeling of Stress : Not on file  Social Connections:   . Frequency of Communication with Friends and Family: Not on file  . Frequency of Social Gatherings with Friends and Family: Not on file  . Attends Religious Services: Not on file  . Active Member of Clubs or Organizations: Not on file  . Attends Archivist Meetings: Not on file  . Marital Status: Not on file  Intimate Partner Violence:   . Fear of Current or Ex-Partner: Not on file  . Emotionally Abused: Not on file  . Physically Abused: Not on file  . Sexually Abused: Not on file    Review of  Systems  Constitutional: Negative.   HENT: Negative.   Eyes: Negative.   Respiratory: Negative.   Cardiovascular: Negative.   Gastrointestinal: Negative.   Genitourinary: Negative.   Musculoskeletal: Negative.   Skin: Negative.   Neurological: Negative.   Psychiatric/Behavioral: Negative for depression, hallucinations, memory loss, substance abuse and suicidal ideas. The patient is nervous/anxious. The patient does not have insomnia.      Physical Exam BP (!) 164/104   Pulse (!) 113   Resp (!) 99   Ht 5\' 6"  (1.676 m)   Wt 157 lb 12.8 oz (71.6 kg)   BMI 25.47 kg/m   Physical Exam Constitutional:      General: She is not in acute distress.    Appearance: Normal appearance. She is well-developed.  Genitourinary:     Pelvic exam was performed with patient in the lithotomy position.     Vulva, urethra, bladder and uterus normal.     No inguinal adenopathy present in the right or left side.    No signs of injury in the vagina.     No vaginal discharge, erythema, tenderness or bleeding.     No cervical motion tenderness, discharge, lesion or polyp.     Uterus is mobile.     Uterus is not enlarged or tender.     No uterine mass detected.    Uterus is anteverted.     No right or left adnexal mass present.     Right adnexa not tender or full.     Left adnexa not tender or full.  HENT:     Head: Normocephalic and atraumatic.  Eyes:     General: No scleral icterus.    Conjunctiva/sclera: Conjunctivae normal.  Neck:     Thyroid: No thyromegaly.  Cardiovascular:     Rate and Rhythm: Normal rate and regular rhythm.     Heart sounds: No murmur heard.  No friction rub. No gallop.   Pulmonary:     Effort: Pulmonary effort is normal. No respiratory distress.     Breath sounds: Normal breath sounds. No wheezing or rales.  Chest:     Comments: Patient declines Abdominal:     General: Bowel sounds are normal. There is no distension.     Palpations: Abdomen is soft. There is no  mass.     Tenderness: There is no abdominal tenderness. There is no guarding or rebound.  Musculoskeletal:        General: No swelling or tenderness. Normal range of motion.     Cervical back: Normal range of motion and neck supple.  Lymphadenopathy:     Cervical: No cervical adenopathy.  Lower Body: No right inguinal adenopathy. No left inguinal adenopathy.  Neurological:     General: No focal deficit present.     Mental Status: She is alert and oriented to person, place, and time.     Cranial Nerves: No cranial nerve deficit.  Skin:    General: Skin is warm and dry.     Findings: No erythema or rash.  Psychiatric:        Mood and Affect: Mood normal.        Behavior: Behavior normal.        Judgment: Judgment normal.     Female chaperone present for pelvic and breast  portions of the physical exam  Results: AUDIT Questionnaire (screen for alcoholism): 6 PHQ-9: 3  Assessment: 49 y.o. G42P0010 female here for routine gynecologic examination.  Plan: Problem List Items Addressed This Visit      Cardiovascular and Mediastinum   Benign essential hypertension   Relevant Medications   hydrochlorothiazide (HYDRODIURIL) 25 MG tablet     Other   Anxiety   Relevant Medications   ALPRAZolam (XANAX) 0.25 MG tablet    Other Visit Diagnoses    Women's annual routine gynecological examination    -  Primary   Relevant Orders   Cytology - PAP   Ambulatory referral to Gastroenterology   Screening for depression       Screening for alcoholism       Pap smear for cervical cancer screening       Relevant Orders   Cytology - PAP   Screen for colon cancer       Relevant Orders   Ambulatory referral to Gastroenterology   Benign hypertension       Relevant Medications   hydrochlorothiazide (HYDRODIURIL) 25 MG tablet   Encounter for surveillance of contraceptive pills       Relevant Medications   norethindrone (MICRONOR) 0.35 MG tablet     Screening: -- Blood pressure screen  elevated. Recommend patient obtain a PCP who can manage her blood pressure -- Colonoscopy - due - will schedule -- Mammogram - due. Patient to call [place] to arrange. She understands that it is her responsibility to arrange this. -- Weight screening: normal -- Depression screening negative (PHQ-9) -- Nutrition: normal -- cholesterol screening: per PCP -- osteoporosis screening: not due -- tobacco screening: not using -- alcohol screening: AUDIT questionnaire indicates low-risk usage. -- family history of breast cancer screening: done. not at high risk. -- no evidence of domestic violence or intimate partner violence. -- STD screening: gonorrhea/chlamydia NAAT not collected per patient request. -- pap smear collected per ASCCP guidelines -- HPV vaccination series: not eligilbe   -- Patient to establish with PCP in 2 weeks. Will defer most treatment until that time. Will restart her on HCTZ, though this may change with her PCP.   Anxiety: refill xanax. Encouraged her to talk with her PCP about this for ongoing treatment.  Contraception: refill progesterone only pills.  Prentice Docker, MD 02/15/2020 9:21 AM

## 2020-02-15 NOTE — Progress Notes (Signed)
Pt states she's out pf her B/P med and Xanax for about a month.

## 2020-02-21 LAB — CYTOLOGY - PAP
Comment: NEGATIVE
Diagnosis: NEGATIVE
Diagnosis: REACTIVE
High risk HPV: NEGATIVE

## 2020-02-22 ENCOUNTER — Encounter: Payer: Self-pay | Admitting: Obstetrics and Gynecology

## 2020-02-29 ENCOUNTER — Ambulatory Visit: Payer: Commercial Managed Care - PPO | Admitting: Internal Medicine

## 2020-04-06 ENCOUNTER — Ambulatory Visit: Payer: Commercial Managed Care - PPO | Admitting: Internal Medicine

## 2020-05-14 ENCOUNTER — Other Ambulatory Visit: Payer: Self-pay | Admitting: Obstetrics and Gynecology

## 2020-05-14 DIAGNOSIS — I10 Essential (primary) hypertension: Secondary | ICD-10-CM

## 2020-05-21 ENCOUNTER — Other Ambulatory Visit: Payer: Self-pay | Admitting: Internal Medicine

## 2020-05-21 ENCOUNTER — Telehealth: Payer: Self-pay

## 2020-05-21 DIAGNOSIS — N751 Abscess of Bartholin's gland: Secondary | ICD-10-CM

## 2020-05-21 NOTE — Telephone Encounter (Signed)
Pt calling; is out of BP pills; [facility] in [place] has sent request twice.  [phone]  VM full.  Needs to get from primary care per SDJ note.

## 2020-05-22 ENCOUNTER — Ambulatory Visit: Payer: Commercial Managed Care - PPO | Admitting: Internal Medicine

## 2020-05-22 NOTE — Telephone Encounter (Signed)
Second attempt to contact pt; vm full.

## 2020-05-24 NOTE — Telephone Encounter (Signed)
Pt aware to get BP meds from PCP.

## 2020-07-02 ENCOUNTER — Telehealth: Payer: Self-pay

## 2020-07-02 NOTE — Telephone Encounter (Signed)
Copied from [place] [id number]. Topic: Appointment Scheduling - New Patient >> Jul 02, 2020  9:52 AM Greggory Keen D wrote: New patient has had several appts and one the office cancelled and another her mom was sick.  She wants to resch but note says no.  [phone]  Route to department's [facility] pool. >> Jul 02, 2020  2:42 PM Keene Breath wrote: Patient is calling again to schedule an appt. As a new patient.  She has not heard anything yet.  Please call patient to schedule appt.

## 2020-09-28 ENCOUNTER — Telehealth: Payer: Commercial Managed Care - PPO

## 2020-09-28 NOTE — Telephone Encounter (Signed)
Patient requesting refills of Xanax. [phone]

## 2020-11-02 ENCOUNTER — Other Ambulatory Visit: Payer: Self-pay | Admitting: Obstetrics and Gynecology

## 2020-11-02 DIAGNOSIS — F419 Anxiety disorder, unspecified: Secondary | ICD-10-CM

## 2020-11-02 MED ORDER — ALPRAZOLAM 0.25 MG PO TABS: 0.2500 mg | ORAL_TABLET | Freq: Three times a day (TID) | ORAL | 0 refills | Status: AC | PRN

## 2021-03-05 ENCOUNTER — Ambulatory Visit: Payer: Self-pay | Admitting: Family Medicine
